# Patient Record
Sex: Female | Born: 1985 | Race: Black or African American | Hispanic: No | Marital: Single | Smoking: Current some day smoker
Health system: Southern US, Community
[De-identification: ages and names within clinical notes are randomized; demographics above are authoritative.]

## PROBLEM LIST (undated history)

## (undated) DIAGNOSIS — K2970 Gastritis, unspecified, without bleeding: Secondary | ICD-10-CM

## (undated) DIAGNOSIS — I499 Cardiac arrhythmia, unspecified: Secondary | ICD-10-CM

## (undated) DIAGNOSIS — K219 Gastro-esophageal reflux disease without esophagitis: Secondary | ICD-10-CM

## (undated) DIAGNOSIS — F411 Generalized anxiety disorder: Secondary | ICD-10-CM

## (undated) DIAGNOSIS — D649 Anemia, unspecified: Secondary | ICD-10-CM

## (undated) DIAGNOSIS — R51 Headache: Secondary | ICD-10-CM

## (undated) DIAGNOSIS — R519 Headache, unspecified: Secondary | ICD-10-CM

## (undated) DIAGNOSIS — J189 Pneumonia, unspecified organism: Secondary | ICD-10-CM

## (undated) DIAGNOSIS — F988 Other specified behavioral and emotional disorders with onset usually occurring in childhood and adolescence: Secondary | ICD-10-CM

## (undated) DIAGNOSIS — R7303 Prediabetes: Secondary | ICD-10-CM

## (undated) DIAGNOSIS — T7840XA Allergy, unspecified, initial encounter: Secondary | ICD-10-CM

## (undated) HISTORY — DX: Allergy, unspecified, initial encounter: T78.40XA

## (undated) HISTORY — DX: Generalized anxiety disorder: F41.1

---
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
## 2014-02-04 ENCOUNTER — Other Ambulatory Visit (HOSPITAL_COMMUNITY)
Admission: RE | Admit: 2014-02-04 | Discharge: 2014-02-04 | Disposition: A | Discharge status: Home / Self Care | Payer: 59 | Source: Ambulatory Visit | Attending: Obstetrics & Gynecology | Admitting: Obstetrics & Gynecology

## 2014-02-04 ENCOUNTER — Other Ambulatory Visit: Payer: Self-pay | Admitting: Obstetrics & Gynecology

## 2014-02-04 DIAGNOSIS — Z1151 Encounter for screening for human papillomavirus (HPV): Secondary | ICD-10-CM | POA: Diagnosis present

## 2014-02-04 DIAGNOSIS — Z01419 Encounter for gynecological examination (general) (routine) without abnormal findings: Secondary | ICD-10-CM | POA: Diagnosis present

## 2014-02-07 LAB — CYTOLOGY - PAP

## 2014-03-23 ENCOUNTER — Ambulatory Visit (INDEPENDENT_AMBULATORY_CARE_PROVIDER_SITE_OTHER): Payer: 59 | Admitting: Family Medicine

## 2014-03-23 ENCOUNTER — Ambulatory Visit (INDEPENDENT_AMBULATORY_CARE_PROVIDER_SITE_OTHER): Payer: 59

## 2014-03-23 VITALS — BP 122/80 | HR 85 | Temp 97.7°F | Resp 18 | Ht 64.0 in | Wt 312.0 lb

## 2014-03-23 DIAGNOSIS — M25562 Pain in left knee: Secondary | ICD-10-CM

## 2014-03-23 DIAGNOSIS — S86812A Strain of other muscle(s) and tendon(s) at lower leg level, left leg, initial encounter: Secondary | ICD-10-CM

## 2014-03-23 DIAGNOSIS — S86912A Strain of unspecified muscle(s) and tendon(s) at lower leg level, left leg, initial encounter: Secondary | ICD-10-CM

## 2014-03-23 MED ORDER — DICLOFENAC SODIUM 75 MG PO TBEC: 75.0000 mg | DELAYED_RELEASE_TABLET | Freq: Two times a day (BID) | ORAL | Status: DC

## 2014-03-23 NOTE — Patient Instructions (Signed)
Please let me know if the knee is not improving by this weekend.

## 2014-03-23 NOTE — Progress Notes (Signed)
Patient ID: Valerie GrimesRashada C Green MRN: [id number], DOB: 07/26/1985, 28 y.o. Date of Encounter: 03/23/2014, 7:39 PM  This chart was scribed for Dr. Elvina SidleKurt Dutch Ing, MD by Jarvis Morganaylor Ferguson, Medical Scribe. This patient was seen in Room 4 and the patient's care was started at 7:40 PM.   Primary Physician: No primary care provider on file.  Chief Complaint:  Chief Complaint  Patient presents with  . Knee Injury    jumping saturday  . Knee Pain  . Edema     HPI: 28 y.o. year old female with history below presents with an injury to her left knee. Pt states she injured it while at a trampoline park 4 days ago. She is now having a constant, waxing and waning, "tight and swollen" feeling in her left knee. She describes the pain as dull. Pt notes that the pain is exacerbated when she is sitting and the pain becomes "throbbing". She is having associated swelling in her right knee. She has been taking Naprosyn with no relief. Pt is able to ambulate without difficulty.  She states the pain is alleviated with ambulation. The pain is not keeping her up at night. She denies any color change, warmth, shortness of breath or chest pain.  Works at [employer]  Past Medical History  Diagnosis Date  . Anxiety      Home Meds: Prior to Admission medications   Not on File    Allergies: No Known Allergies  History   Social History  . Marital Status: Single    Spouse Name: N/A    Number of Children: N/A  . Years of Education: N/A   Occupational History  . Not on file.   Social History Main Topics  . Smoking status: Never Smoker   . Smokeless tobacco: Not on file  . Alcohol Use: No  . Drug Use: No  . Sexual Activity: Not on file   Other Topics Concern  . Not on file   Social History Narrative  . No narrative on file     Review of Systems: Constitutional: negative for chills, fever, night sweats, weight changes, or fatigue  HEENT: negative for vision changes, hearing loss,  congestion, rhinorrhea, ST, epistaxis, or sinus pressure Cardiovascular: negative for chest pain or palpitations Respiratory: negative for hemoptysis, wheezing, shortness of breath, or cough Abdominal: negative for abdominal pain, nausea, vomiting, diarrhea, or constipation Dermatological: negative for rash, warmth, or color change. Neurologic: negative for headache, dizziness, or syncope Musculoskeletal: positive for left knee pain and swelling. Negative for gait issue. All other systems reviewed and are otherwise negative with the exception to those above and in the HPI.   Physical Exam: Blood pressure 122/80, pulse 85, temperature 97.7 F (36.5 C), temperature source Oral, resp. rate 18, height 5\' 4"  (1.626 m), weight 312 lb (141.522 kg), last menstrual period 02/13/2014, SpO2 98 %., Body mass index is 53.53 kg/(m^2). General: Well developed, well nourished, in no acute distress. Head: Normocephalic, atraumatic, eyes without discharge, sclera non-icteric, nares are without discharge. Bilateral auditory canals clear, TM's are without perforation, pearly grey and translucent with reflective cone of light bilaterally. Oral cavity moist, posterior pharynx without exudate, erythema, peritonsillar abscess, or post nasal drip.  Neck: Supple. No thyromegaly. Full ROM. No lymphadenopathy. Lungs: Clear bilaterally to auscultation without wheezes, rales, or rhonchi. Breathing is unlabored. Heart: RRR with S1 S2. No murmurs, rubs, or gallops appreciated. Abdomen: Soft, non-tender, non-distended with normoactive bowel sounds. No hepatomegaly. No rebound/guarding. No obvious abdominal masses. Msk:  Strength and tone normal for age. Extremities/Skin: Warm and dry. No clubbing or cyanosis. No edema. No rashes or suspicious lesions. Neuro: Alert and oriented X 3. Moves all extremities spontaneously. Gait is normal. CNII-XII grossly in tact. Psych:  Responds to questions appropriately with a normal affect.     [facility] reading (PRIMARY) by  Dr. Milus GlazierLauenstein. Xray normal    ASSESSMENT AND PLAN:  28 y.o. year old female with   1. Left knee pain   2. Knee strain, left, initial encounter    Meds ordered this encounter  Medications  . diclofenac (VOLTAREN) 75 MG EC tablet    Sig: Take 1 tablet (75 mg total) by mouth 2 (two) times daily.    Dispense:  30 tablet    Refill:  0   I personally performed the services described in this documentation, which was scribed in my presence. The recorded information has been reviewed and is accurate.   Signed, Elvina SidleKurt Chantille Navarrete, MD 03/23/2014 7:39 PM

## 2014-04-24 ENCOUNTER — Ambulatory Visit (HOSPITAL_BASED_OUTPATIENT_CLINIC_OR_DEPARTMENT_OTHER): Payer: 59 | Attending: Internal Medicine

## 2014-06-27 ENCOUNTER — Ambulatory Visit
Admission: RE | Admit: 2014-06-27 | Discharge: 2014-06-27 | Disposition: A | Discharge status: Home / Self Care | Payer: 59 | Source: Ambulatory Visit | Attending: Internal Medicine | Admitting: Internal Medicine

## 2014-06-27 ENCOUNTER — Other Ambulatory Visit: Payer: Self-pay | Admitting: Internal Medicine

## 2014-06-27 DIAGNOSIS — R0602 Shortness of breath: Secondary | ICD-10-CM

## 2014-11-04 ENCOUNTER — Emergency Department (HOSPITAL_COMMUNITY)
Admission: EM | Admit: 2014-11-04 | Discharge: 2014-11-05 | Disposition: A | Discharge status: Home / Self Care | Payer: 59 | Attending: Emergency Medicine | Admitting: Emergency Medicine

## 2014-11-04 ENCOUNTER — Encounter (HOSPITAL_COMMUNITY): Payer: Self-pay | Admitting: *Deleted

## 2014-11-04 DIAGNOSIS — R197 Diarrhea, unspecified: Secondary | ICD-10-CM | POA: Insufficient documentation

## 2014-11-04 DIAGNOSIS — K2970 Gastritis, unspecified, without bleeding: Secondary | ICD-10-CM | POA: Diagnosis not present

## 2014-11-04 DIAGNOSIS — Z8659 Personal history of other mental and behavioral disorders: Secondary | ICD-10-CM | POA: Insufficient documentation

## 2014-11-04 DIAGNOSIS — Z3202 Encounter for pregnancy test, result negative: Secondary | ICD-10-CM | POA: Insufficient documentation

## 2014-11-04 DIAGNOSIS — R1013 Epigastric pain: Secondary | ICD-10-CM | POA: Diagnosis present

## 2014-11-04 DIAGNOSIS — Z791 Long term (current) use of non-steroidal anti-inflammatories (NSAID): Secondary | ICD-10-CM | POA: Diagnosis not present

## 2014-11-04 MED ORDER — GI COCKTAIL ~~LOC~~
30.0000 mL | Freq: Once | ORAL | Status: AC
  Administered 2014-11-04: 30 mL via ORAL
  Filled 2014-11-04: qty 30

## 2014-11-04 NOTE — ED Notes (Signed)
The pt is c/oi abd pain since Tuesday with diarrhea.  Hyperventilating at present and c/o chest pain  lmp last month

## 2014-11-04 NOTE — ED Provider Notes (Signed)
CSN: [id number]     Arrival date & time 11/04/14  2311 History   First MD Initiated Contact with Patient 11/04/14 2329     Chief Complaint  Patient presents with  . Abdominal Pain     (Consider location/radiation/quality/duration/timing/severity/associated sxs/prior Treatment) Patient is a 29 y.o. female presenting with abdominal pain.  Abdominal Pain Pain location:  Epigastric Pain quality: dull and shooting   Pain radiation: up to chest. Pain severity:  Severe Onset quality:  Gradual Duration:  4 days Timing:  Constant Progression:  Worsening Chronicity:  New Context: not alcohol use and not diet changes   Relieved by: pepto bismol. Worsened by:  Nothing tried (not worse after eating) Ineffective treatments: tylenol. Associated symptoms: diarrhea (NBNB) and nausea   Associated symptoms: no dysuria, no fever and no vomiting     Past Medical History  Diagnosis Date  . Anxiety    History reviewed. No pertinent past surgical history. Family History  Problem Relation Age of Onset  . Diabetes Mother   . Diabetes Father   . Hyperlipidemia Brother    History  Substance Use Topics  . Smoking status: Never Smoker   . Smokeless tobacco: Not on file  . Alcohol Use: No   OB History    No data available     Review of Systems  Constitutional: Negative for fever.  Gastrointestinal: Positive for nausea, abdominal pain and diarrhea (NBNB). Negative for vomiting.  Genitourinary: Negative for dysuria.  All other systems reviewed and are negative.     Allergies  Review of patient's allergies indicates no known allergies.  Home Medications   Prior to Admission medications   Medication Sig Start Date End Date Taking? Authorizing Provider  diclofenac (VOLTAREN) 75 MG EC tablet Take 1 tablet (75 mg total) by mouth 2 (two) times daily. 03/23/14   Elvina SidleKurt Lauenstein, MD   BP 126/90 mmHg  Pulse 72  Temp(Src) 98.5 F (36.9 C)  Resp 38  Ht 5\' 5"  (1.651 m)  Wt 321 lb 6 oz  (145.775 kg)  BMI 53.48 kg/m2  SpO2 100%  LMP 10/05/2014 Physical Exam  Constitutional: She is oriented to person, place, and time. She appears well-developed and well-nourished. No distress.  HENT:  Head: Normocephalic and atraumatic.  Mouth/Throat: Oropharynx is clear and moist.  Eyes: Conjunctivae are normal. Pupils are equal, round, and reactive to light. No scleral icterus.  Neck: Neck supple.  Cardiovascular: Normal rate, regular rhythm, normal heart sounds and intact distal pulses.   No murmur heard. Pulmonary/Chest: Effort normal and breath sounds normal. No stridor. No respiratory distress. She has no rales.  Abdominal: Soft. Bowel sounds are normal. She exhibits no distension. There is tenderness in the epigastric area and left upper quadrant. There is no rigidity, no rebound, no guarding and no CVA tenderness.  Musculoskeletal: Normal range of motion.  Neurological: She is alert and oriented to person, place, and time.  Skin: Skin is warm and dry. No rash noted.  Psychiatric: She has a normal mood and affect. Her behavior is normal.  Nursing note and vitals reviewed.   ED Course  Procedures (including critical care time) Labs Review Labs Reviewed  COMPREHENSIVE METABOLIC PANEL - Abnormal; Notable for the following:    Glucose, Bld 120 (*)    Total Bilirubin 0.2 (*)    All other components within normal limits  LIPASE, BLOOD - Abnormal; Notable for the following:    Lipase 21 (*)    All other components within normal limits  CBC WITH DIFFERENTIAL/PLATELET  URINALYSIS, ROUTINE W REFLEX MICROSCOPIC (NOT AT [facility])  POC URINE PREG, ED    Imaging Review No results found.   EKG Interpretation   Date/Time:  Friday November 04 2014 23:18:19 EDT Ventricular Rate:  58 PR Interval:  166 QRS Duration: 88 QT Interval:  404 QTC Calculation: 396 R Axis:   74 Text Interpretation:  Sinus bradycardia with sinus arrhythmia Otherwise  normal ECG No old tracing to compare  Confirmed by [facility]  MD, DAVID ([id number])  on 11/04/2014 11:26:41 PM      MDM   Final diagnoses:  Epigastric abdominal pain  Gastritis    29 yo female with epigastric pain.  Symptoms and exam consistent with gastritis.  No significant RUQ tenderness.  Pain not worse after eating. Don't think she needs imaging.  Felt better after GI cocktail, norco, and protonix.  She was breathing fast due to pain during initial exam, but this settled down after pain meds.  DC with PPI and follow up.     Blake Divine, MD 11/05/14 [phone]

## 2014-11-05 LAB — COMPREHENSIVE METABOLIC PANEL
ALBUMIN: 4 g/dL (ref 3.5–5.0)
ALK PHOS: 53 U/L (ref 38–126)
ALT: 16 U/L (ref 14–54)
AST: 25 U/L (ref 15–41)
Anion gap: 10 (ref 5–15)
BUN: 10 mg/dL (ref 6–20)
CALCIUM: 9.2 mg/dL (ref 8.9–10.3)
CO2: 25 mmol/L (ref 22–32)
Chloride: 103 mmol/L (ref 101–111)
Creatinine, Ser: 0.86 mg/dL (ref 0.44–1.00)
Glucose, Bld: 120 mg/dL — ABNORMAL HIGH (ref 65–99)
POTASSIUM: 3.7 mmol/L (ref 3.5–5.1)
Sodium: 138 mmol/L (ref 135–145)
TOTAL PROTEIN: 8.1 g/dL (ref 6.5–8.1)
Total Bilirubin: 0.2 mg/dL — ABNORMAL LOW (ref 0.3–1.2)

## 2014-11-05 LAB — CBC WITH DIFFERENTIAL/PLATELET
Basophils Absolute: 0 10*3/uL (ref 0.0–0.1)
Basophils Relative: 0 % (ref 0–1)
EOS ABS: 0.1 10*3/uL (ref 0.0–0.7)
Eosinophils Relative: 1 % (ref 0–5)
HEMATOCRIT: 37.3 % (ref 36.0–46.0)
HEMOGLOBIN: 12.9 g/dL (ref 12.0–15.0)
Lymphocytes Relative: 37 % (ref 12–46)
Lymphs Abs: 2.8 10*3/uL (ref 0.7–4.0)
MCH: 29.1 pg (ref 26.0–34.0)
MCHC: 34.6 g/dL (ref 30.0–36.0)
MCV: 84 fL (ref 78.0–100.0)
Monocytes Absolute: 0.6 10*3/uL (ref 0.1–1.0)
Monocytes Relative: 8 % (ref 3–12)
Neutro Abs: 4.1 10*3/uL (ref 1.7–7.7)
Neutrophils Relative %: 54 % (ref 43–77)
PLATELETS: 265 10*3/uL (ref 150–400)
RBC: 4.44 MIL/uL (ref 3.87–5.11)
RDW: 12.6 % (ref 11.5–15.5)
WBC: 7.6 10*3/uL (ref 4.0–10.5)

## 2014-11-05 LAB — URINALYSIS, ROUTINE W REFLEX MICROSCOPIC
Bilirubin Urine: NEGATIVE
Glucose, UA: NEGATIVE mg/dL
HGB URINE DIPSTICK: NEGATIVE
Ketones, ur: NEGATIVE mg/dL
Leukocytes, UA: NEGATIVE
NITRITE: NEGATIVE
PH: 6 (ref 5.0–8.0)
PROTEIN: NEGATIVE mg/dL
Specific Gravity, Urine: 1.024 (ref 1.005–1.030)
Urobilinogen, UA: 0.2 mg/dL (ref 0.0–1.0)

## 2014-11-05 LAB — POC URINE PREG, ED: Preg Test, Ur: NEGATIVE

## 2014-11-05 LAB — LIPASE, BLOOD: LIPASE: 21 U/L — AB (ref 22–51)

## 2014-11-05 MED ORDER — PANTOPRAZOLE SODIUM 40 MG PO TBEC
40.0000 mg | DELAYED_RELEASE_TABLET | Freq: Once | ORAL | Status: AC
  Administered 2014-11-05: 40 mg via ORAL
  Filled 2014-11-05: qty 1

## 2014-11-05 MED ORDER — HYDROCODONE-ACETAMINOPHEN 5-325 MG PO TABS: 1.0000 | ORAL_TABLET | ORAL | Status: DC | PRN

## 2014-11-05 MED ORDER — OMEPRAZOLE 20 MG PO CPDR: 20.0000 mg | DELAYED_RELEASE_CAPSULE | Freq: Two times a day (BID) | ORAL | Status: DC

## 2014-11-05 MED ORDER — HYDROCODONE-ACETAMINOPHEN 5-325 MG PO TABS
2.0000 | ORAL_TABLET | Freq: Once | ORAL | Status: AC
  Administered 2014-11-05: 2 via ORAL
  Filled 2014-11-05: qty 2

## 2014-11-05 NOTE — Discharge Instructions (Signed)
Abdominal Pain, Women °Abdominal (stomach, pelvic, or belly) pain can be caused by many things. It is important to tell your doctor: °· The location of the pain. °· Does it come and go or is it present all the time? °· Are there things that start the pain (eating certain foods, exercise)? °· Are there other symptoms associated with the pain (fever, nausea, vomiting, diarrhea)? °All of this is helpful to know when trying to find the cause of the pain. °CAUSES  °· Stomach: virus or bacteria infection, or ulcer. °· Intestine: appendicitis (inflamed appendix), regional ileitis (Crohn's disease), ulcerative colitis (inflamed colon), irritable bowel syndrome, diverticulitis (inflamed diverticulum of the colon), or cancer of the stomach or intestine. °· Gallbladder disease or stones in the gallbladder. °· Kidney disease, kidney stones, or infection. °· Pancreas infection or cancer. °· Fibromyalgia (pain disorder). °· Diseases of the female organs: °¨ Uterus: fibroid (non-cancerous) tumors or infection. °¨ Fallopian tubes: infection or tubal pregnancy. °¨ Ovary: cysts or tumors. °¨ Pelvic adhesions (scar tissue). °¨ Endometriosis (uterus lining tissue growing in the pelvis and on the pelvic organs). °¨ Pelvic congestion syndrome (female organs filling up with blood just before the menstrual period). °¨ Pain with the menstrual period. °¨ Pain with ovulation (producing an egg). °¨ Pain with an IUD (intrauterine device, birth control) in the uterus. °¨ Cancer of the female organs. °· Functional pain (pain not caused by a disease, may improve without treatment). °· Psychological pain. °· Depression. °DIAGNOSIS  °Your doctor will decide the seriousness of your pain by doing an examination. °· Blood tests. °· X-rays. °· Ultrasound. °· CT scan (computed tomography, special type of X-ray). °· MRI (magnetic resonance imaging). °· Cultures, for infection. °· Barium enema (dye inserted in the large intestine, to better view it with  X-rays). °· Colonoscopy (looking in intestine with a lighted tube). °· Laparoscopy (minor surgery, looking in abdomen with a lighted tube). °· Major abdominal exploratory surgery (looking in abdomen with a large incision). °TREATMENT  °The treatment will depend on the cause of the pain.  °· Many cases can be observed and treated at home. °· Over-the-counter medicines recommended by your caregiver. °· Prescription medicine. °· Antibiotics, for infection. °· Birth control pills, for painful periods or for ovulation pain. °· Hormone treatment, for endometriosis. °· Nerve blocking injections. °· Physical therapy. °· Antidepressants. °· Counseling with a psychologist or psychiatrist. °· Minor or major surgery. °HOME CARE INSTRUCTIONS  °· Do not take laxatives, unless directed by your caregiver. °· Take over-the-counter pain medicine only if ordered by your caregiver. Do not take aspirin because it can cause an upset stomach or bleeding. °· Try a clear liquid diet (broth or water) as ordered by your caregiver. Slowly move to a bland diet, as tolerated, if the pain is related to the stomach or intestine. °· Have a thermometer and take your temperature several times a day, and record it. °· Bed rest and sleep, if it helps the pain. °· Avoid sexual intercourse, if it causes pain. °· Avoid stressful situations. °· Keep your follow-up appointments and tests, as your caregiver orders. °· If the pain does not go away with medicine or surgery, you may try: °¨ Acupuncture. °¨ Relaxation exercises (yoga, meditation). °¨ Group therapy. °¨ Counseling. °SEEK MEDICAL CARE IF:  °· You notice certain foods cause stomach pain. °· Your home care treatment is not helping your pain. °· You need stronger pain medicine. °· You want your IUD removed. °· You feel faint or   lightheaded. °· You develop nausea and vomiting. °· You develop a rash. °· You are having side effects or an allergy to your medicine. °SEEK IMMEDIATE MEDICAL CARE IF:  °· Your  pain does not go away or gets worse. °· You have a fever. °· Your pain is felt only in portions of the abdomen. The right side could possibly be appendicitis. The left lower portion of the abdomen could be colitis or diverticulitis. °· You are passing blood in your stools (bright red or black tarry stools, with or without vomiting). °· You have blood in your urine. °· You develop chills, with or without a fever. °· You pass out. °MAKE SURE YOU:  °· Understand these instructions. °· Will watch your condition. °· Will get help right away if you are not doing well or get worse. °Document Released: 02/17/2007 Document Revised: 09/06/2013 Document Reviewed: 03/09/2009 °ExitCare® Patient Information ©2015 [employer]. This information is not intended to replace advice given to you by your health care provider. Make sure you discuss any questions you have with your health care provider. ° °Gastritis, Adult °Gastritis is soreness and swelling (inflammation) of the lining of the stomach. Gastritis can develop as a sudden onset (acute) or long-term (chronic) condition. If gastritis is not treated, it can lead to stomach bleeding and ulcers. °CAUSES  °Gastritis occurs when the stomach lining is weak or damaged. Digestive juices from the stomach then inflame the weakened stomach lining. The stomach lining may be weak or damaged due to viral or bacterial infections. One common bacterial infection is the Helicobacter pylori infection. Gastritis can also result from excessive alcohol consumption, taking certain medicines, or having too much acid in the stomach.  °SYMPTOMS  °In some cases, there are no symptoms. When symptoms are present, they may include: °· Pain or a burning sensation in the upper abdomen. °· Nausea. °· Vomiting. °· An uncomfortable feeling of fullness after eating. °DIAGNOSIS  °Your caregiver may suspect you have gastritis based on your symptoms and a physical exam. To determine the cause of your gastritis,  your caregiver may perform the following: °· Blood or stool tests to check for the H pylori bacterium. °· Gastroscopy. A thin, flexible tube (endoscope) is passed down the esophagus and into the stomach. The endoscope has a light and camera on the end. Your caregiver uses the endoscope to view the inside of the stomach. °· Taking a tissue sample (biopsy) from the stomach to examine under a microscope. °TREATMENT  °Depending on the cause of your gastritis, medicines may be prescribed. If you have a bacterial infection, such as an H pylori infection, antibiotics may be given. If your gastritis is caused by too much acid in the stomach, H2 blockers or antacids may be given. Your caregiver may recommend that you stop taking aspirin, ibuprofen, or other nonsteroidal anti-inflammatory drugs (NSAIDs). °HOME CARE INSTRUCTIONS °· Only take over-the-counter or prescription medicines as directed by your caregiver. °· If you were given antibiotic medicines, take them as directed. Finish them even if you start to feel better. °· Drink enough fluids to keep your urine clear or pale yellow. °· Avoid foods and drinks that make your symptoms worse, such as: °¨ Caffeine or alcoholic drinks. °¨ Chocolate. °¨ Peppermint or mint flavorings. °¨ Garlic and onions. °¨ Spicy foods. °¨ Citrus fruits, such as oranges, lemons, or limes. °¨ Tomato-based foods such as sauce, chili, salsa, and pizza. °¨ Fried and fatty foods. °· Eat small, frequent meals instead of large meals. °  SEEK IMMEDIATE MEDICAL CARE IF:  °· You have black or dark red stools. °· You vomit blood or material that looks like coffee grounds. °· You are unable to keep fluids down. °· Your abdominal pain gets worse. °· You have a fever. °· You do not feel better after 1 week. °· You have any other questions or concerns. °MAKE SURE YOU: °· Understand these instructions. °· Will watch your condition. °· Will get help right away if you are not doing well or get worse. °Document  Released: 04/16/2001 Document Revised: 10/22/2011 Document Reviewed: 06/05/2011 °ExitCare® Patient Information ©2015 [employer]. This information is not intended to replace advice given to you by your health care provider. Make sure you discuss any questions you have with your health care provider. ° °

## 2015-03-09 ENCOUNTER — Emergency Department (HOSPITAL_COMMUNITY): Payer: 59

## 2015-03-09 ENCOUNTER — Emergency Department (HOSPITAL_COMMUNITY)
Admission: EM | Admit: 2015-03-09 | Discharge: 2015-03-09 | Disposition: A | Discharge status: Home / Self Care | Payer: 59 | Attending: Emergency Medicine | Admitting: Emergency Medicine

## 2015-03-09 ENCOUNTER — Encounter (HOSPITAL_COMMUNITY): Payer: Self-pay

## 2015-03-09 DIAGNOSIS — R1011 Right upper quadrant pain: Secondary | ICD-10-CM | POA: Diagnosis present

## 2015-03-09 DIAGNOSIS — Z8659 Personal history of other mental and behavioral disorders: Secondary | ICD-10-CM | POA: Insufficient documentation

## 2015-03-09 DIAGNOSIS — Z79899 Other long term (current) drug therapy: Secondary | ICD-10-CM | POA: Insufficient documentation

## 2015-03-09 DIAGNOSIS — Z3202 Encounter for pregnancy test, result negative: Secondary | ICD-10-CM | POA: Insufficient documentation

## 2015-03-09 DIAGNOSIS — K8020 Calculus of gallbladder without cholecystitis without obstruction: Secondary | ICD-10-CM

## 2015-03-09 DIAGNOSIS — K2970 Gastritis, unspecified, without bleeding: Secondary | ICD-10-CM | POA: Insufficient documentation

## 2015-03-09 HISTORY — DX: Other specified behavioral and emotional disorders with onset usually occurring in childhood and adolescence: F98.8

## 2015-03-09 HISTORY — DX: Gastritis, unspecified, without bleeding: K29.70

## 2015-03-09 LAB — URINALYSIS, ROUTINE W REFLEX MICROSCOPIC
Bilirubin Urine: NEGATIVE
Glucose, UA: NEGATIVE mg/dL
Hgb urine dipstick: NEGATIVE
KETONES UR: 15 mg/dL — AB
LEUKOCYTES UA: NEGATIVE
Nitrite: NEGATIVE
PH: 6 (ref 5.0–8.0)
Protein, ur: NEGATIVE mg/dL
Specific Gravity, Urine: 1.029 (ref 1.005–1.030)
Urobilinogen, UA: 1 mg/dL (ref 0.0–1.0)

## 2015-03-09 LAB — CBC WITH DIFFERENTIAL/PLATELET
BASOS ABS: 0 10*3/uL (ref 0.0–0.1)
Basophils Relative: 0 %
Eosinophils Absolute: 0 10*3/uL (ref 0.0–0.7)
Eosinophils Relative: 0 %
HCT: 36.9 % (ref 36.0–46.0)
HEMOGLOBIN: 12.8 g/dL (ref 12.0–15.0)
Lymphocytes Relative: 21 %
Lymphs Abs: 1.9 10*3/uL (ref 0.7–4.0)
MCH: 29.5 pg (ref 26.0–34.0)
MCHC: 34.7 g/dL (ref 30.0–36.0)
MCV: 85 fL (ref 78.0–100.0)
MONO ABS: 0.4 10*3/uL (ref 0.1–1.0)
MONOS PCT: 4 %
NEUTROS ABS: 6.9 10*3/uL (ref 1.7–7.7)
Neutrophils Relative %: 75 %
PLATELETS: 230 10*3/uL (ref 150–400)
RBC: 4.34 MIL/uL (ref 3.87–5.11)
RDW: 12.7 % (ref 11.5–15.5)
WBC: 9.2 10*3/uL (ref 4.0–10.5)

## 2015-03-09 LAB — COMPREHENSIVE METABOLIC PANEL
ALK PHOS: 53 U/L (ref 38–126)
ALT: 19 U/L (ref 14–54)
AST: 25 U/L (ref 15–41)
Albumin: 4.1 g/dL (ref 3.5–5.0)
Anion gap: 10 (ref 5–15)
BUN: 7 mg/dL (ref 6–20)
CO2: 25 mmol/L (ref 22–32)
CREATININE: 0.98 mg/dL (ref 0.44–1.00)
Calcium: 9.3 mg/dL (ref 8.9–10.3)
Chloride: 104 mmol/L (ref 101–111)
GFR calc non Af Amer: >60 mL/min (ref >60)
Glucose, Bld: 143 mg/dL — ABNORMAL HIGH (ref 65–99)
Potassium: 3.7 mmol/L (ref 3.5–5.1)
SODIUM: 139 mmol/L (ref 135–145)
Total Bilirubin: 0.4 mg/dL (ref 0.3–1.2)
Total Protein: 8.2 g/dL — ABNORMAL HIGH (ref 6.5–8.1)

## 2015-03-09 LAB — I-STAT CHEM 8, ED
BUN: 8 mg/dL (ref 6–20)
CREATININE: 0.9 mg/dL (ref 0.44–1.00)
Calcium, Ion: 1.13 mmol/L (ref 1.12–1.23)
Chloride: 102 mmol/L (ref 101–111)
GLUCOSE: 135 mg/dL — AB (ref 65–99)
HCT: 41 % (ref 36.0–46.0)
Hemoglobin: 13.9 g/dL (ref 12.0–15.0)
Potassium: 3.6 mmol/L (ref 3.5–5.1)
Sodium: 140 mmol/L (ref 135–145)
TCO2: 24 mmol/L (ref 0–100)

## 2015-03-09 LAB — POC URINE PREG, ED: PREG TEST UR: NEGATIVE

## 2015-03-09 LAB — LIPASE, BLOOD: Lipase: 24 U/L (ref 11–51)

## 2015-03-09 MED ORDER — HYDROCODONE-ACETAMINOPHEN 5-325 MG PO TABS: 2.0000 | ORAL_TABLET | Freq: Two times a day (BID) | ORAL | Status: DC | PRN

## 2015-03-09 MED ORDER — MORPHINE SULFATE (PF) 4 MG/ML IV SOLN
4.0000 mg | Freq: Once | INTRAVENOUS | Status: AC
Start: 2015-03-09 — End: 2015-03-09
  Administered 2015-03-09: 4 mg via INTRAVENOUS
  Filled 2015-03-09: qty 1

## 2015-03-09 MED ORDER — ONDANSETRON 4 MG PO TBDP: 4.0000 mg | ORAL_TABLET | Freq: Three times a day (TID) | ORAL | Status: DC | PRN

## 2015-03-09 NOTE — ED Notes (Signed)
Pt states that she took omeprazole and tramadol at home with no pain relief, pt states that she may have thrown up the medication.

## 2015-03-09 NOTE — ED Notes (Signed)
MD at bedside. 

## 2015-03-09 NOTE — ED Notes (Signed)
Per pt she is having right upper quadrant pain that started around 12 am tonight. She states that she has vomited 2 times, denies diarrhea. Pt states that she has been diagnosed with gastritis before and this feels similar, but not the same.

## 2015-03-09 NOTE — ED Provider Notes (Signed)
CSN: [id number]     Arrival date & time 03/09/15  0453 History   First MD Initiated Contact with Patient 03/09/15 0456     Chief Complaint  Patient presents with  . Abdominal Pain     (Consider location/radiation/quality/duration/timing/severity/associated sxs/prior Treatment) HPI   Valerie Green is a 29 y.o. female with no significant past medical history presenting today with abdominal pain. Patient states it began around midnight, is in her right upper quadrant and is described as pain that radiates up into her throat. She states she has been seen for this before diagnosed with gastritis. Patient has been taking omeprazole and has been compliant, this has not relieved her pain. She had 2 episodes of emesis tonight as well. She denies any lower abdominal pain, or changes in her bowel or bladder. She's had no fevers. She denies any history of gallbladder or liver pathology.  Currently she is denying nausea and only has pain. She has no further complaints.  10 Systems reviewed and are negative for acute change except as noted in the HPI.     Past Medical History  Diagnosis Date  . Anxiety   . Gastritis   . ADD (attention deficit disorder)    Past Surgical History  Procedure Laterality Date  . Tooth extraction     Family History  Problem Relation Age of Onset  . Diabetes Mother   . Diabetes Father   . Hyperlipidemia Brother    Social History  Substance Use Topics  . Smoking status: Never Smoker   . Smokeless tobacco: None  . Alcohol Use: No   OB History    No data available     Review of Systems    Allergies  Milk-related compounds  Home Medications   Prior to Admission medications   Medication Sig Start Date End Date Taking? Authorizing Provider  HYDROcodone-acetaminophen (NORCO/VICODIN) 5-325 MG per tablet Take 1-2 tablets by mouth every 4 (four) hours as needed for severe pain. 11/05/14   Blake Divine, MD  omeprazole (PRILOSEC) 20 MG capsule Take 1 capsule  (20 mg total) by mouth 2 (two) times daily before a meal. 11/05/14   Blake Divine, MD   BP 116/66 mmHg  Pulse 60  Temp(Src) 97.4 F (36.3 C) (Oral)  Resp 18  Ht  (1.651 m)  SpO2 100%  LMP 03/05/2015 Physical Exam  Constitutional: She is oriented to person, place, and time. She appears well-developed and well-nourished. No distress.  HENT:  Head: Normocephalic and atraumatic.  Nose: Nose normal.  Mouth/Throat: Oropharynx is clear and moist. No oropharyngeal exudate.  Eyes: Conjunctivae and EOM are normal. Pupils are equal, round, and reactive to light. No scleral icterus.  Neck: Normal range of motion. Neck supple. No JVD present. No tracheal deviation present. No thyromegaly present.  Cardiovascular: Normal rate, regular rhythm and normal heart sounds.  Exam reveals no gallop and no friction rub.   No murmur heard. Pulmonary/Chest: Effort normal and breath sounds normal. No respiratory distress. She has no wheezes. She exhibits no tenderness.  Abdominal: Soft. Bowel sounds are normal. She exhibits no distension and no mass. There is tenderness. There is no rebound and no guarding.  Right upper quadrant tenderness to palpation. Positive Murphy's sign.  Musculoskeletal: Normal range of motion. She exhibits no edema or tenderness.  Lymphadenopathy:    She has no cervical adenopathy.  Neurological: She is alert and oriented to person, place, and time. No cranial nerve deficit. She exhibits normal muscle tone.  Skin:  Skin is warm and dry. No rash noted. No erythema. No pallor.  Nursing note and vitals reviewed.   ED Course  Procedures (including critical care time) Labs Review Labs Reviewed  COMPREHENSIVE METABOLIC PANEL - Abnormal; Notable for the following:    Glucose, Bld 143 (*)    Total Protein 8.2 (*)    All other components within normal limits  URINALYSIS, ROUTINE W REFLEX MICROSCOPIC (NOT AT [facility]) - Abnormal; Notable for the following:    Ketones, ur 15 (*)    All  other components within normal limits  I-STAT CHEM 8, ED - Abnormal; Notable for the following:    Glucose, Bld 135 (*)    All other components within normal limits  CBC WITH DIFFERENTIAL/PLATELET  LIPASE, BLOOD  POC URINE PREG, ED    Imaging Review [place] Abdomen Limited Ruq  03/09/2015  CLINICAL DATA:  Right upper quadrant pain, vomiting. EXAM: US ABDOMEN LIMITED - RIGHT UPPER QUADRANT COMPARISON:  None. FINDINGS: Gallbladder: Two calculi, the largest measuring 13 mm. No gallbladder wall thickening. No tenderness over the gallbladder. Common bile duct: Diameter: 4.6 mm Liver: No focal lesion identified. Within normal limits in parenchymal echogenicity. IMPRESSION: Cholelithiasis.  No sonographic evidence of acute cholecystitis. Electronically Signed   By: Ellery Plunkaniel R Mitchell M.D.   On: 03/09/2015 06:06   I have personally reviewed and evaluated these images and lab results as part of my medical decision-making.   EKG Interpretation None      MDM   Final diagnoses:  RUQ pain    Patient presents to the emergency department for right upper quadrant pain. I have concern for possible biliary or liver pathology. We'll obtain laboratory studies, right upper quadrant ultrasound for evaluation. Patient was given morphine for pain control.  Ultrasound reveals 2 large calculi, largest measuring 13 mm. Patient now feels better after medications. Education regarding cholelithiasis is provided along with surgical follow-up. She appears well in no acute distress, vital signs remain within her normal limits and she is safe for discharge.    Tomasita CrumbleAdeleke Micky Sheller, MD 03/09/15 [phone]

## 2015-03-09 NOTE — Discharge Instructions (Signed)
Cholelithiasis Ms. Reeves, your ultrasound shows gallstones. Take nausea and pain medication as needed. See surgery within 3 days for close follow-up. If any symptoms worsen or he develop fever come back to the emergency department immediately. Thank you.  Cholelithiasis (also called gallstones) is a form of gallbladder disease. The gallbladder is a small organ that helps you digest fats. Symptoms of gallstones are:  Feeling sick to your stomach (nausea).  Throwing up (vomiting).  Belly pain.  Yellowing of the skin (jaundice).  Sudden pain. You may feel the pain for minutes to hours.  Fever.  Pain to the touch. HOME CARE  Only take medicines as told by your doctor.  Eat a low-fat diet until you see your doctor again. Eating fat can result in pain.  Follow up with your doctor as told. Attacks usually happen time after time. Surgery is usually needed for permanent treatment. GET HELP RIGHT AWAY IF:   Your pain gets worse.  Your pain is not helped by medicines.  You have a fever and lasting symptoms for more than 2-3 days.  You have a fever and your symptoms suddenly get worse.  You keep feeling sick to your stomach and throwing up. MAKE SURE YOU:   Understand these instructions.  Will watch your condition.  Will get help right away if you are not doing well or get worse.   This information is not intended to replace advice given to you by your health care provider. Make sure you discuss any questions you have with your health care provider.   Document Released: 10/09/2007 Document Revised: 12/23/2012 Document Reviewed: 10/14/2012 Elsevier Interactive Patient Education [employer]2016 Elsevier Inc.

## 2015-05-12 ENCOUNTER — Emergency Department (HOSPITAL_COMMUNITY)
Admission: EM | Admit: 2015-05-12 | Discharge: 2015-05-12 | Discharge status: Against Medical Advice | Payer: 59 | Attending: Emergency Medicine | Admitting: Emergency Medicine

## 2015-05-12 ENCOUNTER — Encounter (HOSPITAL_COMMUNITY): Payer: Self-pay | Admitting: Emergency Medicine

## 2015-05-12 DIAGNOSIS — R197 Diarrhea, unspecified: Secondary | ICD-10-CM | POA: Diagnosis not present

## 2015-05-12 DIAGNOSIS — K625 Hemorrhage of anus and rectum: Secondary | ICD-10-CM | POA: Diagnosis not present

## 2015-05-12 DIAGNOSIS — R110 Nausea: Secondary | ICD-10-CM | POA: Diagnosis not present

## 2015-05-12 LAB — COMPREHENSIVE METABOLIC PANEL
ALT: 31 U/L (ref 14–54)
ANION GAP: 7 (ref 5–15)
AST: 30 U/L (ref 15–41)
Albumin: 3.8 g/dL (ref 3.5–5.0)
Alkaline Phosphatase: 52 U/L (ref 38–126)
BILIRUBIN TOTAL: 0.4 mg/dL (ref 0.3–1.2)
BUN: 8 mg/dL (ref 6–20)
CALCIUM: 9.2 mg/dL (ref 8.9–10.3)
CO2: 26 mmol/L (ref 22–32)
CREATININE: 0.9 mg/dL (ref 0.44–1.00)
Chloride: 108 mmol/L (ref 101–111)
GFR calc non Af Amer: >60 mL/min (ref >60)
GLUCOSE: 126 mg/dL — AB (ref 65–99)
POTASSIUM: 3.9 mmol/L (ref 3.5–5.1)
Sodium: 141 mmol/L (ref 135–145)
Total Protein: 7.6 g/dL (ref 6.5–8.1)

## 2015-05-12 LAB — LIPASE, BLOOD: Lipase: 27 U/L (ref 11–51)

## 2015-05-12 LAB — CBC
HCT: 35.6 % — ABNORMAL LOW (ref 36.0–46.0)
Hemoglobin: 12.1 g/dL (ref 12.0–15.0)
MCH: 29 pg (ref 26.0–34.0)
MCHC: 34 g/dL (ref 30.0–36.0)
MCV: 85.4 fL (ref 78.0–100.0)
PLATELETS: 231 10*3/uL (ref 150–400)
RBC: 4.17 MIL/uL (ref 3.87–5.11)
RDW: 12.7 % (ref 11.5–15.5)
WBC: 7.9 10*3/uL (ref 4.0–10.5)

## 2015-05-12 LAB — URINE MICROSCOPIC-ADD ON: WBC, UA: NONE SEEN WBC/hpf (ref 0–5)

## 2015-05-12 LAB — URINALYSIS, ROUTINE W REFLEX MICROSCOPIC
Bilirubin Urine: NEGATIVE
Glucose, UA: NEGATIVE mg/dL
KETONES UR: NEGATIVE mg/dL
LEUKOCYTES UA: NEGATIVE
Nitrite: NEGATIVE
PROTEIN: NEGATIVE mg/dL
Specific Gravity, Urine: 1.018 (ref 1.005–1.030)
pH: 5.5 (ref 5.0–8.0)

## 2015-05-12 LAB — POC URINE PREG, ED: Preg Test, Ur: NEGATIVE

## 2015-05-12 NOTE — ED Notes (Signed)
Pt informed this RN that she was leaving and did not need to be seen. Pt stated that "no I do not need any services, it was a false alarm. The bleeding is coming from a different hole." Pt signed out AMA.    MD and Charge RN notified

## 2015-05-12 NOTE — ED Notes (Signed)
Pt. reports nausea , diarrhea and rectal bleeding onset yesterday , denies emesis , no fever or chills. Pt. stated rectal bleeding started after several bouts of diarrhea .

## 2015-09-07 ENCOUNTER — Ambulatory Visit (INDEPENDENT_AMBULATORY_CARE_PROVIDER_SITE_OTHER): Payer: 59 | Admitting: Gastroenterology

## 2015-09-07 ENCOUNTER — Ambulatory Visit: Payer: Self-pay | Admitting: Gastroenterology

## 2015-09-07 VITALS — BP 146/91 | HR 62 | Temp 98.0°F | Ht 65.0 in | Wt 301.0 lb

## 2015-09-07 DIAGNOSIS — R1115 Cyclical vomiting syndrome unrelated to migraine: Secondary | ICD-10-CM

## 2015-09-07 DIAGNOSIS — F411 Generalized anxiety disorder: Secondary | ICD-10-CM

## 2015-09-07 DIAGNOSIS — G43A0 Cyclical vomiting, not intractable: Secondary | ICD-10-CM

## 2015-09-07 DIAGNOSIS — K582 Mixed irritable bowel syndrome: Secondary | ICD-10-CM | POA: Diagnosis not present

## 2015-09-07 DIAGNOSIS — G8929 Other chronic pain: Secondary | ICD-10-CM | POA: Diagnosis not present

## 2015-09-07 DIAGNOSIS — R1013 Epigastric pain: Secondary | ICD-10-CM

## 2015-09-07 DIAGNOSIS — F419 Anxiety disorder, unspecified: Secondary | ICD-10-CM | POA: Insufficient documentation

## 2015-09-07 HISTORY — DX: Generalized anxiety disorder: F41.1

## 2015-09-07 NOTE — Progress Notes (Signed)
Gastroenterology Consultation  Referring Provider:     No ref. provider found Primary Care Physician:  No PCP Per Patient Primary Gastroenterologist:  Dr. Servando SnareWohl     Reason for Consultation:     Abdominal pain        HPI:   Valerie Green is a 30 y.o. y/o female referred for consultation & management of Abdominal pain by Dr. Bonnetta BarryNo PCP Per Patient.  This patient comes today with a history of abdominal pain. The patient reports that her abdominal pain is associated with heartburn and alternating diarrhea and constipation. The patient states that she has not had any unexplained weight loss. The patient's symptoms resulted in her undergoing an ultrasound which showed her to have gallstones but no sign of acute cholecystitis. There is no report of any black stools or bloody stools. There is no family history of colon cancer colon polyps. The patient reports that chocolate and spicy foods make her symptoms worse as does some fatty foods. There is no report of any blood in her vomitus. The pain will start in the epigastric area and then it will radiate around to the right side. There is no radiation of the pain to the shoulder.  Past Medical History  Diagnosis Date  . Anxiety   . Gastritis   . ADD (attention deficit disorder)   . Anxiety state 09/07/2015  . Allergy     Past Surgical History  Procedure Laterality Date  . Tooth extraction  2000    Prior to Admission medications   Medication Sig Start Date End Date Taking? Authorizing Provider  acetaminophen (TYLENOL) 500 MG tablet Take 500-1,000 mg by mouth every 6 (six) hours as needed for mild pain.   Yes Historical Provider, MD  cetirizine (ZYRTEC) 10 MG tablet Take 10 mg by mouth daily.   Yes Historical Provider, MD  Multiple Vitamin (MULTIVITAMIN) tablet Take 1 tablet by mouth daily.   Yes Historical Provider, MD  omeprazole (PRILOSEC) 20 MG capsule Take 1 capsule (20 mg total) by mouth 2 (two) times daily before a meal. Patient taking  differently: Take 20 mg by mouth 2 (two) times daily as needed (acid reflux).  11/05/14  Yes Blake DivineJohn Wofford, MD    Family History  Problem Relation Age of Onset  . Diabetes Mother   . Diabetes Father   . Hyperlipidemia Brother   . Heart disease Maternal Aunt   . Diabetes Paternal Uncle      Social History  Substance Use Topics  . Smoking status: Never Smoker   . Smokeless tobacco: Never Used  . Alcohol Use: No    Allergies as of 09/07/2015 - Review Complete 09/07/2015  Allergen Reaction Noted  . Milk-related compounds Other (See Comments) 11/04/2014    Review of Systems:    All systems reviewed and negative except where noted in HPI.   Physical Exam:  BP 146/91 mmHg  Pulse 62  Temp(Src) 98 F (36.7 C) (Oral)  Ht 5\' 5"  (1.651 m)  Wt 301 lb (136.533 kg)  BMI 50.09 kg/m2  LMP 08/21/2015 Patient's last menstrual period was 08/21/2015. Psych:  Alert and cooperative. Normal mood and affect. General:   Alert,  Well-developed, obese, well-nourished, pleasant and cooperative in NAD Head:  Normocephalic and atraumatic. Eyes:  Sclera clear, no icterus.   Conjunctiva pink. Ears:  Normal auditory acuity. Nose:  No deformity, discharge, or lesions. Mouth:  No deformity or lesions,oropharynx pink & moist. Neck:  Supple; no masses or thyromegaly. Lungs:  Respirations even and unlabored.  Clear throughout to auscultation.   No wheezes, crackles, or rhonchi. No acute distress. Heart:  Regular rate and rhythm; no murmurs, clicks, rubs, or gallops. Abdomen:  Normal bowel sounds.  No bruits.  Soft, non-tender and non-distended without masses, hepatosplenomegaly or hernias noted.  No guarding or rebound tenderness.  Negative Carnett sign.   Rectal:  Deferred.  Msk:  Symmetrical without gross deformities.  Good, equal movement & strength bilaterally. Pulses:  Normal pulses noted. Extremities:  No clubbing or edema.  No cyanosis. Neurologic:  Alert and oriented x3;  grossly normal  neurologically. Skin:  Intact without significant lesions or rashes.  No jaundice. Lymph Nodes:  No significant cervical adenopathy. Psych:  Alert and cooperative. Normal mood and affect.  Imaging Studies: No results found.  Assessment and Plan:   Valerie Green is a 30 y.o. y/o female who comes in today with epigastric discomfort that radiates to the right side. The patient reports it is made worse with chocolate and spicy foods in addition to fatty foods. The patient had an ultrasound that had some gallstones but no sign of all bladder disease. The patient takes Prilosec as needed and states that it does help at times when she does it. The patient has been given samples of Dexilant 60 mg to be taken once a day. She will try this for a few weeks and if her symptoms do not improve she may need to be tried on dicyclomine for possible irritable bowel syndrome with her alternating diarrhea and constipation and bloating. The patient has been explained the plan and agrees with it.   Note: This dictation was prepared with Dragon dictation along with smaller phrase technology. Any transcriptional errors that result from this process are unintentional.

## 2015-10-10 ENCOUNTER — Ambulatory Visit: Payer: 59 | Admitting: Gastroenterology

## 2015-11-10 ENCOUNTER — Ambulatory Visit (HOSPITAL_COMMUNITY)
Admission: EM | Admit: 2015-11-10 | Discharge: 2015-11-10 | Disposition: A | Discharge status: Nursing Home (Includes ICF) | Payer: 59 | Attending: Family Medicine | Admitting: Family Medicine

## 2015-11-10 ENCOUNTER — Encounter (HOSPITAL_COMMUNITY): Payer: Self-pay | Admitting: *Deleted

## 2015-11-10 ENCOUNTER — Encounter (HOSPITAL_COMMUNITY): Payer: Self-pay | Admitting: Emergency Medicine

## 2015-11-10 ENCOUNTER — Emergency Department (HOSPITAL_COMMUNITY)
Admission: EM | Admit: 2015-11-10 | Discharge: 2015-11-11 | Disposition: A | Discharge status: Home / Self Care | Payer: 59 | Attending: Emergency Medicine | Admitting: Emergency Medicine

## 2015-11-10 DIAGNOSIS — R1013 Epigastric pain: Secondary | ICD-10-CM | POA: Diagnosis not present

## 2015-11-10 DIAGNOSIS — R109 Unspecified abdominal pain: Secondary | ICD-10-CM

## 2015-11-10 DIAGNOSIS — K8050 Calculus of bile duct without cholangitis or cholecystitis without obstruction: Secondary | ICD-10-CM | POA: Diagnosis not present

## 2015-11-10 DIAGNOSIS — R1011 Right upper quadrant pain: Secondary | ICD-10-CM | POA: Diagnosis not present

## 2015-11-10 LAB — LIPASE, BLOOD: LIPASE: 25 U/L (ref 11–51)

## 2015-11-10 LAB — CBC
HEMATOCRIT: 36.2 % (ref 36.0–46.0)
HEMOGLOBIN: 12 g/dL (ref 12.0–15.0)
MCH: 28.4 pg (ref 26.0–34.0)
MCHC: 33.1 g/dL (ref 30.0–36.0)
MCV: 85.6 fL (ref 78.0–100.0)
PLATELETS: 261 10*3/uL (ref 150–400)
RBC: 4.23 MIL/uL (ref 3.87–5.11)
RDW: 12.6 % (ref 11.5–15.5)
WBC: 6.7 10*3/uL (ref 4.0–10.5)

## 2015-11-10 LAB — COMPREHENSIVE METABOLIC PANEL
ALT: 17 U/L (ref 14–54)
ANION GAP: 6 (ref 5–15)
AST: 23 U/L (ref 15–41)
Albumin: 3.8 g/dL (ref 3.5–5.0)
Alkaline Phosphatase: 49 U/L (ref 38–126)
BUN: 10 mg/dL (ref 6–20)
CHLORIDE: 106 mmol/L (ref 101–111)
CO2: 25 mmol/L (ref 22–32)
CREATININE: 0.82 mg/dL (ref 0.44–1.00)
Calcium: 9.2 mg/dL (ref 8.9–10.3)
Glucose, Bld: 138 mg/dL — ABNORMAL HIGH (ref 65–99)
POTASSIUM: 3.9 mmol/L (ref 3.5–5.1)
SODIUM: 137 mmol/L (ref 135–145)
Total Bilirubin: <0.1 mg/dL — ABNORMAL LOW (ref 0.3–1.2)
Total Protein: 7.7 g/dL (ref 6.5–8.1)

## 2015-11-10 LAB — URINALYSIS, ROUTINE W REFLEX MICROSCOPIC
Bilirubin Urine: NEGATIVE
GLUCOSE, UA: NEGATIVE mg/dL
Ketones, ur: NEGATIVE mg/dL
LEUKOCYTES UA: NEGATIVE
Nitrite: NEGATIVE
PROTEIN: NEGATIVE mg/dL
SPECIFIC GRAVITY, URINE: 1.031 — AB (ref 1.005–1.030)
pH: 6.5 (ref 5.0–8.0)

## 2015-11-10 LAB — URINE MICROSCOPIC-ADD ON: WBC, UA: NONE SEEN WBC/hpf (ref 0–5)

## 2015-11-10 MED ORDER — HYDROMORPHONE HCL 1 MG/ML IJ SOLN
INTRAMUSCULAR | Status: AC
  Filled 2015-11-10: qty 2

## 2015-11-10 MED ORDER — ONDANSETRON 4 MG PO TBDP
4.0000 mg | ORAL_TABLET | Freq: Once | ORAL | Status: AC
  Administered 2015-11-10: 4 mg via ORAL

## 2015-11-10 MED ORDER — ONDANSETRON 4 MG PO TBDP
ORAL_TABLET | ORAL | Status: AC
  Filled 2015-11-10: qty 1

## 2015-11-10 MED ORDER — HYDROMORPHONE HCL 1 MG/ML IJ SOLN
2.0000 mg | Freq: Once | INTRAMUSCULAR | Status: AC
  Administered 2015-11-10: 2 mg via INTRAMUSCULAR

## 2015-11-10 NOTE — ED Notes (Signed)
Pt reports  r  Upper quadrant  Abdominal  Pain     With  Some      Nausea   As  Well     Pt  Has  A  History of gallstones in  Past

## 2015-11-10 NOTE — ED Notes (Signed)
Pt  Advised  Remain npo   

## 2015-11-10 NOTE — ED Notes (Signed)
[facility] with [facility].  Patient has been seen before for cholecystis without obstruction.  Patient continues with abdominal pain on the right side, nausea, no vomiting.  Patient is CAOx4 in triage.  [facility] and nausea has resolved at this time.

## 2015-11-10 NOTE — ED Provider Notes (Signed)
CSN: [id number]     Arrival date & time 11/10/15  1905 History   First MD Initiated Contact with Patient 11/10/15 1922     No chief complaint on file.  (Consider location/radiation/quality/duration/timing/severity/associated sxs/prior Treatment) Patient is a 30 y.o. female presenting with abdominal pain. The history is provided by the patient.  Abdominal Pain Pain location:  RUQ Pain quality: cramping   Pain radiates to:  Back Pain severity:  Moderate Onset quality:  Sudden Duration:  3 hours Timing:  Constant Progression:  Worsening Chronicity:  Recurrent Context: diet changes   Context comment:  Prob related to pizza eaten today. Associated symptoms: nausea   Associated symptoms: no constipation, no fever and no vomiting   Associated symptoms comment:  Pain is 10/10 currently. Risk factors: obesity   Risk factors: not pregnant   Risk factors comment:  Dx in nov with gb and seen by gastro in may for re-eval.   Past Medical History  Diagnosis Date  . Gastritis   . ADD (attention deficit disorder)   . Anxiety state 09/07/2015  . Allergy    Past Surgical History  Procedure Laterality Date  . Tooth extraction  2000   Family History  Problem Relation Age of Onset  . Diabetes Mother   . Diabetes Father   . Hyperlipidemia Brother   . Heart disease Maternal Aunt   . Diabetes Paternal Uncle    Social History  Substance Use Topics  . Smoking status: Never Smoker   . Smokeless tobacco: Never Used  . Alcohol Use: No   OB History    No data available     Review of Systems  Constitutional: Negative.  Negative for fever.  Gastrointestinal: Positive for nausea and abdominal pain. Negative for vomiting, constipation and abdominal distention.  All other systems reviewed and are negative.   Allergies  Milk-related compounds  Home Medications   Prior to Admission medications   Medication Sig Start Date End Date Taking? Authorizing Provider  acetaminophen (TYLENOL) 500  MG tablet Take 500-1,000 mg by mouth every 6 (six) hours as needed for mild pain.    Historical Provider, MD  cetirizine (ZYRTEC) 10 MG tablet Take 10 mg by mouth daily.    Historical Provider, MD  Multiple Vitamin (MULTIVITAMIN) tablet Take 1 tablet by mouth daily.    Historical Provider, MD  omeprazole (PRILOSEC) 20 MG capsule Take 1 capsule (20 mg total) by mouth 2 (two) times daily before a meal. Patient taking differently: Take 20 mg by mouth 2 (two) times daily as needed (acid reflux).  11/05/14   Blake DivineJohn Wofford, MD   Meds Ordered and Administered this Visit   Medications  HYDROmorphone (DILAUDID) injection 2 mg (2 mg Intramuscular Given 11/10/15 1938)  ondansetron (ZOFRAN-ODT) disintegrating tablet 4 mg (4 mg Oral Given 11/10/15 1938)    There were no vitals taken for this visit. No data found.   Physical Exam  Constitutional: She is oriented to person, place, and time. She appears well-developed and well-nourished. No distress.  Abdominal: Soft. She exhibits no distension and no mass. Bowel sounds are decreased. There is no hepatosplenomegaly. There is tenderness in the right upper quadrant. There is positive Murphy's sign. There is no rigidity, no rebound, no guarding, no CVA tenderness and no tenderness at McBurney's point.    Neurological: She is alert and oriented to person, place, and time.  Skin: Skin is warm and dry.  Nursing note and vitals reviewed.   ED Course  Procedures (including critical care  time)  Labs Review Labs Reviewed - No data to display  Imaging Review No results found.   Visual Acuity Review  Right Eye Distance:   Left Eye Distance:   Bilateral Distance:    Right Eye Near:   Left Eye Near:    Bilateral Near:         MDM   1. Colicky RUQ abdominal pain    Sent for eval of prob acute chole. In pt with known gb problem.    Linna HoffJames D Cherell Colvin, MD 11/10/15 [phone]

## 2015-11-11 ENCOUNTER — Emergency Department (HOSPITAL_COMMUNITY): Payer: 59

## 2015-11-11 MED ORDER — MORPHINE SULFATE (PF) 4 MG/ML IV SOLN
4.0000 mg | Freq: Once | INTRAVENOUS | Status: AC
  Administered 2015-11-11: 4 mg via INTRAVENOUS
  Filled 2015-11-11: qty 1

## 2015-11-11 MED ORDER — HYDROCODONE-ACETAMINOPHEN 5-325 MG PO TABS: 1.0000 | ORAL_TABLET | Freq: Four times a day (QID) | ORAL | Status: DC | PRN

## 2015-11-11 MED ORDER — ONDANSETRON HCL 4 MG/2ML IJ SOLN
4.0000 mg | Freq: Once | INTRAMUSCULAR | Status: AC
  Administered 2015-11-11: 4 mg via INTRAVENOUS
  Filled 2015-11-11: qty 2

## 2015-11-11 MED ORDER — ONDANSETRON 4 MG PO TBDP: 4.0000 mg | ORAL_TABLET | Freq: Three times a day (TID) | ORAL | Status: DC | PRN

## 2015-11-11 NOTE — ED Notes (Signed)
Patient transported to Ultrasound 

## 2015-11-11 NOTE — Discharge Instructions (Signed)
Biliary Colic °Biliary colic is a pain in the upper abdomen. The pain: °· Is usually felt on the right side of the abdomen, but it may also be felt in the center of the abdomen, just below the breastbone (sternum). °· May spread back toward the right shoulder blade. °· May be steady or irregular. °· May be accompanied by nausea and vomiting. °Most of the time, the pain goes away in 1-5 hours. After the most intense pain passes, the abdomen may continue to ache mildly for about 24 hours. °Biliary colic is caused by a blockage in the bile duct. The bile duct is a pathway that carries bile--a liquid that helps to digest fats--from the gallbladder to the small intestine. Biliary colic usually occurs after eating, when the digestive system demands bile. The pain develops when muscle cells contract forcefully to try to move the blockage so that bile can get by. °HOME CARE INSTRUCTIONS °· Take medicines only as directed by your health care provider. °· Drink enough fluid to keep your urine clear or pale yellow. °· Avoid fatty, greasy, and fried foods. These kinds of foods increase your body's demand for bile. °· Avoid any foods that make your pain worse. °· Avoid overeating. °· Avoid having a large meal after fasting. °SEEK MEDICAL CARE IF: °· You develop a fever. °· Your pain gets worse. °· You vomit. °· You develop nausea that prevents you from eating and drinking. °SEEK IMMEDIATE MEDICAL CARE IF: °· You suddenly develop a fever and shaking chills. °· You develop a yellowish discoloration (jaundice) of: °¨ Skin. °¨ Whites of the eyes. °¨ Mucous membranes. °· You have continuous or severe pain that is not relieved with medicines. °· You have nausea and vomiting that is not relieved with medicines. °· You develop dizziness or you faint. °  °This information is not intended to replace advice given to you by your health care provider. Make sure you discuss any questions you have with your health care provider. °  °Document  Released: 09/23/2005 Document Revised: 09/06/2014 Document Reviewed: 02/01/2014 °Elsevier Interactive Patient Education ©2016 Elsevier Inc. ° °

## 2015-11-11 NOTE — ED Provider Notes (Signed)
CSN: [id number]     Arrival date & time 11/10/15  2007 History  By signing my name below, I, Valerie Green, attest that this documentation has been prepared under the direction and in the presence of Shon Batonourtney F Horton, MD. Electronically Signed: Phillis HaggisGabriella Green, ED Scribe. 11/11/2015. 2:36 AM.    Chief Complaint  Patient presents with  . Abdominal Pain   The history is provided by the patient. No language interpreter was used.  HPI Comments: Valerie Green is a 30 y.o. Female with a hx of gastritis and gallstones who presents to the Emergency Department complaining of gradually worsening, recurrent, shooting RUQ abdominal pain onset earlier today. She currently rates her pain 4/10. She reports that she took omeprazole before eating slices of pizza, but this did not help prevent abdominal pain.She reports associated nausea. She was seen at Urgent Care for the same today and given medication to relief. She states that the medication has since worn off.  Pt states that she had similar symptoms in the past year when she was diagnosed with gallstones. She has followed up with a GI doctor, but not a general surgeon for her gallstones. She denies fever, diarrhea, and vomiting. Pt takes omeprazole daily.   Past Medical History  Diagnosis Date  . Gastritis   . ADD (attention deficit disorder)   . Anxiety state 09/07/2015  . Allergy    Past Surgical History  Procedure Laterality Date  . Tooth extraction  2000   Family History  Problem Relation Age of Onset  . Diabetes Mother   . Diabetes Father   . Hyperlipidemia Brother   . Heart disease Maternal Aunt   . Diabetes Paternal Uncle    Social History  Substance Use Topics  . Smoking status: Never Smoker   . Smokeless tobacco: Never Used  . Alcohol Use: No   OB History    No data available     Review of Systems  Constitutional: Negative for fever.  Gastrointestinal: Positive for nausea and abdominal pain. Negative for vomiting and  diarrhea.  All other systems reviewed and are negative.  Allergies  Other and Milk-related compounds  Home Medications   Prior to Admission medications   Medication Sig Start Date End Date Taking? Authorizing Provider  acetaminophen (TYLENOL) 500 MG tablet Take 500-1,000 mg by mouth every 6 (six) hours as needed for mild pain.   Yes Historical Provider, MD  omeprazole (PRILOSEC) 20 MG capsule Take 1 capsule (20 mg total) by mouth 2 (two) times daily before a meal. 11/05/14  Yes Blake DivineJohn Wofford, MD  HYDROcodone-acetaminophen (NORCO/VICODIN) 5-325 MG tablet Take 1-2 tablets by mouth every 6 (six) hours as needed. 11/11/15   Shon Batonourtney F Horton, MD  ondansetron (ZOFRAN ODT) 4 MG disintegrating tablet Take 1 tablet (4 mg total) by mouth every 8 (eight) hours as needed for nausea or vomiting. 11/11/15   Shon Batonourtney F Horton, MD   BP 120/64 mmHg  Pulse 46  Temp(Src) 98.1 F (36.7 C) (Oral)  Resp 16  Ht 5\' 4"  (1.626 m)  Wt 298 lb (135.172 kg)  BMI 51.13 kg/m2  SpO2 100%  LMP 11/04/2015 (Exact Date) Physical Exam  Constitutional: She is oriented to person, place, and time. She appears well-developed and well-nourished.  Overweight  HENT:  Head: Normocephalic and atraumatic.  Cardiovascular: Normal rate, regular rhythm and normal heart sounds.   No murmur heard. Pulmonary/Chest: Effort normal and breath sounds normal. No respiratory distress. She has no wheezes.  Abdominal: Soft. Bowel sounds  are normal. There is tenderness. There is no rebound and no guarding.  Epigastric and right upper quadrant tenderness to palpation without rebound or guarding, negative Murphy's  Neurological: She is alert and oriented to person, place, and time.  Skin: Skin is warm and dry.  Psychiatric: She has a normal mood and affect.  Nursing note and vitals reviewed.   ED Course  Procedures (including critical care time) DIAGNOSTIC STUDIES: Oxygen Saturation is 100% on RA, normal by my interpretation.     COORDINATION OF CARE: 12:20 AM-Discussed treatment plan which includes labs and [place] with pt at bedside and pt agreed to plan.    Labs Review Labs Reviewed  COMPREHENSIVE METABOLIC PANEL - Abnormal; Notable for the following:    Glucose, Bld 138 (*)    Total Bilirubin <0.1 (*)    All other components within normal limits  URINALYSIS, ROUTINE W REFLEX MICROSCOPIC (NOT AT [facility]) - Abnormal; Notable for the following:    Specific Gravity, Urine 1.031 (*)    Hgb urine dipstick TRACE (*)    All other components within normal limits  URINE MICROSCOPIC-ADD ON - Abnormal; Notable for the following:    Squamous Epithelial / LPF 0-5 (*)    Bacteria, UA RARE (*)    All other components within normal limits  LIPASE, BLOOD  CBC    Imaging Review US Abdomen Limited Ruq  11/11/2015  CLINICAL DATA:  RIGHT upper quadrant pain beginning at 4 p.m. History of cholelithiasis. EXAM: US ABDOMEN LIMITED - RIGHT UPPER QUADRANT COMPARISON:  Limited abdominal sonogram March 09, 2015 FINDINGS: Gallbladder: Multiple echogenic gallstones measure up to 1.5 cm, with acoustic shadowing. No gallbladder distention. No gallbladder wall thickening or pericholecystic fluid. No sonographic Murphy's sign elicited. Common bile duct: Diameter: 5 mm Liver: No focal lesion identified. Normal echogenicity. No intrahepatic biliary dilatation. No perihepatic free fluid. Hepatopetal portal vein. IMPRESSION: Cholelithiasis without sonographic findings of acute cholecystitis. Electronically Signed   By: Awilda Metro M.D.   On: 11/11/2015 02:06   I have personally reviewed and evaluated these images and lab results as part of my medical decision-making.   EKG Interpretation None      MDM   Final diagnoses:  Abdominal pain  Biliary colic    Patient presents with recurrent abdominal pain. History of gallstones. Reports worsening of symptoms tonight. Last ultrasound was in November 2016. She's tender without signs of  peritonitis on exam. Vital signs reassuring. Patient was given pain and nausea medication. Repeat ultrasound obtained and shows evidence of gallstones without evidence of cholecystitis.  Lab work is reassuring. Patient is able to tolerate fluids. Will discharge him with pain and nausea medication. Follow-up general surgery for elective cholecystectomy.  After history, exam, and medical workup I feel the patient has been appropriately medically screened and is safe for discharge home. Pertinent diagnoses were discussed with the patient. Patient was given return precautions.  I personally performed the services described in this documentation, which was scribed in my presence. The recorded information has been reviewed and is accurate.      Shon Baton, MD 11/11/15 [phone]

## 2015-11-29 ENCOUNTER — Ambulatory Visit (HOSPITAL_COMMUNITY)
Admission: EM | Admit: 2015-11-29 | Discharge: 2015-11-29 | Disposition: A | Discharge status: Home / Self Care | Payer: 59 | Attending: Family Medicine | Admitting: Family Medicine

## 2015-11-29 ENCOUNTER — Other Ambulatory Visit: Payer: Self-pay | Admitting: Surgery

## 2015-11-29 ENCOUNTER — Encounter (HOSPITAL_COMMUNITY): Payer: Self-pay | Admitting: *Deleted

## 2015-11-29 DIAGNOSIS — J358 Other chronic diseases of tonsils and adenoids: Secondary | ICD-10-CM | POA: Diagnosis not present

## 2015-11-29 HISTORY — DX: Prediabetes: R73.03

## 2015-11-29 NOTE — ED Triage Notes (Signed)
Patient reports tongue irritation since Friday and noted white pus pocket to left tonsil yesterday, no fever no sore throat.

## 2015-11-29 NOTE — ED Provider Notes (Signed)
[facility]    CSN: [id number] Arrival date & time: 11/29/15  1052  First Provider Contact:  None       History   Chief Complaint Chief Complaint  Patient presents with  . Sore Throat    HPI Valerie Green is a 30 y.o. female.    Sore Throat  This is a new problem. The current episode started 2 days ago (white spot on left tonsil.). The problem has not changed since onset.Pertinent negatives include no chest pain and no abdominal pain. She has tried nothing for the symptoms.    Past Medical History:  Diagnosis Date  . ADD (attention deficit disorder)   . Allergy   . Anxiety state 09/07/2015  . Gastritis   . Pre-diabetes     Patient Active Problem List   Diagnosis Date Noted  . Anxiety state 09/07/2015    Past Surgical History:  Procedure Laterality Date  . TOOTH EXTRACTION  2000    OB History    No data available       Home Medications    Prior to Admission medications   Medication Sig Start Date End Date Taking? Authorizing Provider  omeprazole (PRILOSEC) 20 MG capsule Take 1 capsule (20 mg total) by mouth 2 (two) times daily before a meal. 11/05/14  Yes Blake Divine, MD  acetaminophen (TYLENOL) 500 MG tablet Take 500-1,000 mg by mouth every 6 (six) hours as needed for mild pain.    Historical Provider, MD  HYDROcodone-acetaminophen (NORCO/VICODIN) 5-325 MG tablet Take 1-2 tablets by mouth every 6 (six) hours as needed. 11/11/15   Shon Baton, MD  ondansetron (ZOFRAN ODT) 4 MG disintegrating tablet Take 1 tablet (4 mg total) by mouth every 8 (eight) hours as needed for nausea or vomiting. 11/11/15   Shon Baton, MD    Family History Family History  Problem Relation Age of Onset  . Diabetes Mother   . Diabetes Father   . Hyperlipidemia Brother   . Heart disease Maternal Aunt   . Diabetes Paternal Uncle     Social History Social History  Substance Use Topics  . Smoking status: Never Smoker  . Smokeless tobacco: Never Used  .  Alcohol use No     Allergies   Other and Milk-related compounds   Review of Systems Review of Systems  Constitutional: Negative.   HENT: Positive for sore throat. Negative for congestion, drooling, postnasal drip and rhinorrhea.   Cardiovascular: Negative for chest pain.  Gastrointestinal: Negative for abdominal pain.  Hematological: Negative for adenopathy.  All other systems reviewed and are negative.    Physical Exam Triage Vital Signs ED Triage Vitals [11/29/15 1144]  Enc Vitals Group     BP      Pulse Rate 70     Resp 16     Temp 98.6 F (37 C)     Temp Source Oral     SpO2 100 %     Weight      Height      Head Circumference      Peak Flow      Pain Score      Pain Loc      Pain Edu?      Excl. in GC?    No data found.   Updated Vital Signs Pulse 70   Temp 98.6 F (37 C) (Oral)   Resp 16   LMP 11/04/2015 (Exact Date)   SpO2 100%   Visual Acuity Right Eye  Distance:   Left Eye Distance:   Bilateral Distance:    Right Eye Near:   Left Eye Near:    Bilateral Near:     Physical Exam  Constitutional: She appears well-developed and well-nourished.  HENT:  Right Ear: External ear normal.  Left Ear: External ear normal.  Nose: Nose normal.  Shiny white concretion left tonsillar pit removed with tongue blade without diff-resolved.  Eyes: Pupils are equal, round, and reactive to light.  Neck: Normal range of motion. Neck supple.  Lymphadenopathy:    She has no cervical adenopathy.  Nursing note and vitals reviewed.    UC Treatments / Results  Labs (all labs ordered are listed, but only abnormal results are displayed) Labs Reviewed - No data to display  EKG  EKG Interpretation None       Radiology No results found.  Procedures Procedures (including critical care time)  Medications Ordered in UC Medications - No data to display   Initial Impression / Assessment and Plan / UC Course  I have reviewed the triage vital signs and  the nursing notes.  Pertinent labs & imaging results that were available during my care of the patient were reviewed by me and considered in my medical decision making (see chart for details).  Clinical Course      Final Clinical Impressions(s) / UC Diagnoses   Final diagnoses:  Tonsillar debris    New Prescriptions New Prescriptions   No medications on file     Linna Hoff, MD 11/29/15 1221

## 2015-11-29 NOTE — Discharge Instructions (Signed)
Salt gargle as needed. Return if any problems.

## 2015-12-19 ENCOUNTER — Encounter (HOSPITAL_COMMUNITY): Payer: Self-pay

## 2015-12-19 ENCOUNTER — Encounter (HOSPITAL_COMMUNITY)
Admission: RE | Admit: 2015-12-19 | Discharge: 2015-12-19 | Disposition: A | Discharge status: Home / Self Care | Payer: 59 | Source: Ambulatory Visit | Attending: Surgery | Admitting: Surgery

## 2015-12-19 DIAGNOSIS — K8010 Calculus of gallbladder with chronic cholecystitis without obstruction: Secondary | ICD-10-CM | POA: Diagnosis present

## 2015-12-19 DIAGNOSIS — R739 Hyperglycemia, unspecified: Secondary | ICD-10-CM | POA: Diagnosis not present

## 2015-12-19 DIAGNOSIS — Z87891 Personal history of nicotine dependence: Secondary | ICD-10-CM | POA: Diagnosis not present

## 2015-12-19 DIAGNOSIS — Z79899 Other long term (current) drug therapy: Secondary | ICD-10-CM | POA: Diagnosis not present

## 2015-12-19 DIAGNOSIS — K219 Gastro-esophageal reflux disease without esophagitis: Secondary | ICD-10-CM | POA: Diagnosis not present

## 2015-12-19 DIAGNOSIS — Z6841 Body Mass Index (BMI) 40.0 and over, adult: Secondary | ICD-10-CM | POA: Diagnosis not present

## 2015-12-19 HISTORY — DX: Headache, unspecified: R51.9

## 2015-12-19 HISTORY — DX: Anemia, unspecified: D64.9

## 2015-12-19 HISTORY — DX: Gastro-esophageal reflux disease without esophagitis: K21.9

## 2015-12-19 HISTORY — DX: Headache: R51

## 2015-12-19 HISTORY — DX: Pneumonia, unspecified organism: J18.9

## 2015-12-19 HISTORY — DX: Cardiac arrhythmia, unspecified: I49.9

## 2015-12-19 LAB — COMPREHENSIVE METABOLIC PANEL
ALBUMIN: 3.7 g/dL (ref 3.5–5.0)
ALT: 17 U/L (ref 14–54)
AST: 26 U/L (ref 15–41)
Alkaline Phosphatase: 42 U/L (ref 38–126)
Anion gap: 6 (ref 5–15)
BUN: 8 mg/dL (ref 6–20)
CHLORIDE: 106 mmol/L (ref 101–111)
CO2: 25 mmol/L (ref 22–32)
CREATININE: 0.91 mg/dL (ref 0.44–1.00)
Calcium: 9 mg/dL (ref 8.9–10.3)
GFR calc Af Amer: >60 mL/min (ref >60)
GLUCOSE: 104 mg/dL — AB (ref 65–99)
Potassium: 3.9 mmol/L (ref 3.5–5.1)
Sodium: 137 mmol/L (ref 135–145)
Total Bilirubin: 0.3 mg/dL (ref 0.3–1.2)
Total Protein: 7.3 g/dL (ref 6.5–8.1)

## 2015-12-19 LAB — CBC WITH DIFFERENTIAL/PLATELET
BASOS ABS: 0 10*3/uL (ref 0.0–0.1)
BASOS PCT: 0 %
EOS PCT: 1 %
Eosinophils Absolute: 0.1 10*3/uL (ref 0.0–0.7)
HCT: 37.2 % (ref 36.0–46.0)
Hemoglobin: 12.5 g/dL (ref 12.0–15.0)
LYMPHS PCT: 51 %
Lymphs Abs: 3.2 10*3/uL (ref 0.7–4.0)
MCH: 28.7 pg (ref 26.0–34.0)
MCHC: 33.6 g/dL (ref 30.0–36.0)
MCV: 85.5 fL (ref 78.0–100.0)
Monocytes Absolute: 0.3 10*3/uL (ref 0.1–1.0)
Monocytes Relative: 5 %
Neutro Abs: 2.7 10*3/uL (ref 1.7–7.7)
Neutrophils Relative %: 43 %
PLATELETS: 250 10*3/uL (ref 150–400)
RBC: 4.35 MIL/uL (ref 3.87–5.11)
RDW: 12.5 % (ref 11.5–15.5)
WBC: 6.3 10*3/uL (ref 4.0–10.5)

## 2015-12-19 LAB — HCG, SERUM, QUALITATIVE: PREG SERUM: NEGATIVE

## 2015-12-19 NOTE — Pre-Procedure Instructions (Signed)
Valerie Green  12/19/2015      [name]- [place], [place] - [address] [place] [address] Phone: [phone] Fax: [phone]  [facility] [id number] [name]- [place] - [id number] [facility] [address] [facility] [address] [place] [address] Phone: [phone] Fax: [phone]    Your procedure is scheduled on Aug. 18  [facility] Admitting at 1100 A.M.  Call this number if you have problems the morning of surgery:  [phone]   Remember:  Do not eat food or drink liquids after midnight.  Take these medicines the morning of surgery with A SIP OF WATER Tylenol  Or Hydrocodone (Norco)if needed,Omeprazole (Prilosec)  Stop taking aspirin, BC's, Goody's, herbal medications, Fish Oil, Ibuprofen, Advil, Motrin, Aleve, Vitamins   Do not wear jewelry, make-up or nail polish.  Do not wear lotions, powders, or perfumes.  You may wear deoderant.  Do not shave 48 hours prior to surgery.  Men may shave face and neck.  Do not bring valuables to the hospital.  [facility] is not responsible for any belongings or valuables.  Contacts, dentures or bridgework may not be worn into surgery.  Leave your suitcase in the car.  After surgery it may be brought to your room.  For patients admitted to the hospital, discharge time will be determined by your treatment team.  Patients discharged the day of surgery will not be allowed to drive home.    Special instructions:  [facility] - Preparing for Surgery  Before surgery, you can play an important role.  Because skin is not sterile, your skin needs to be as free of germs as possible.  You can reduce the number of germs on you skin by washing with CHG (chlorahexidine gluconate) soap before surgery.  CHG is an antiseptic cleaner which kills germs and bonds with the skin to continue killing germs even after washing.  Please DO NOT use if  you have an allergy to CHG or antibacterial soaps.  If your skin becomes reddened/irritated stop using the CHG and inform your nurse when you arrive at Short Stay.  Do not shave (including legs and underarms) for at least 48 hours prior to the first CHG shower.  You may shave your face.  Please follow these instructions carefully:   1.  Shower with CHG Soap the night before surgery and the   morning of Surgery.  2.  If you choose to wash your hair, wash your hair first as usual with your normal shampoo.  3.  After you shampoo, rinse your hair and body thoroughly to remove the                      Shampoo.  4.  Use CHG as you would any other liquid soap.  You can apply chg directly       to the skin and wash gently with scrungie or a clean washcloth.  5.  Apply the CHG Soap to your body ONLY FROM THE NECK DOWN.        Do not use on open wounds or open sores.  Avoid contact with your eyes,       ears, mouth and genitals (private parts).  Wash genitals (private parts)       with your normal soap.  6.  Wash thoroughly, paying special attention to the area where  your surgery        will be performed.  7.  Thoroughly rinse your body with warm water from the neck down.  8.  DO NOT shower/wash with your normal soap after using and rinsing off       the CHG Soap.  9.  Pat yourself dry with a clean towel.            10.  Wear clean pajamas.            11.  Place clean sheets on your bed the night of your first shower and do not        sleep with pets.  Day of Surgery  Do not apply any lotions/deoderants the morning of surgery.  Please wear clean clothes to the hospital/surgery center.     Please read over the following fact sheets that you were given. Pain Booklet, Coughing and Deep Breathing and Surgical Site Infection Prevention

## 2015-12-19 NOTE — Progress Notes (Signed)
Denies having a PCP. Denies ever seeing a cardiologist. Denies ever having a card cath, stress test, or echo.

## 2015-12-20 LAB — HEMOGLOBIN A1C
HEMOGLOBIN A1C: 5.9 % — AB (ref 4.8–5.6)
MEAN PLASMA GLUCOSE: 123 mg/dL

## 2015-12-21 MED ORDER — CEFAZOLIN SODIUM 10 G IJ SOLR
3.0000 g | INTRAMUSCULAR | Status: AC
  Administered 2015-12-22: 3 g via INTRAVENOUS
  Filled 2015-12-21: qty 3000

## 2015-12-21 NOTE — H&P (Signed)
Valerie Green  Location: [facility] Patient #: [id number] DOB: 06/15/1985 Single / Language: [place] / Race: Black or African American Female  History of Present Illness   The patient is a 30 year old female who presents with a complaint of gall bladder disease.   The PCP is none.  The patient was referred by Dr. [name]. Horton.  She is accompanied by her mother, Valerie Green.  The patient has had symptoms of abdominal pain going for almost a year. She has made several parents as to the emergency room to evaluate this abdominal pain. It is sometimes related to food, but sometimes not. She says that tomatoes, citrus, and onions make her symptoms worse. She has been seen in the emergency room on 04 November 2014, on 09 March 2015, on 12 May 2015, and the 10 November 2015 for abdominal symptoms. She did have an ultrasound of 09 March 2015 which showed gallstones. She appeared to be unaware of these results.  She [facility] and then [facility] on 11 November 2015. An ultrasound showed multiple gallstones. Her labs showed normal liver functions and she was asked to follow up in our office. On her labs she has shown a persistently elevated blood sugar between 126-143.  I discussed with the patient the indications and risks of gall bladder surgery. The primary risks of gall bladder surgery include, but are not limited to, bleeding, infection, common bile duct injury, and open surgery. There is also the risk that the patient may have continued symptoms after surgery. We discussed the typical post-operative recovery course. I tried to answer the patient's questions. I gave the patient literature about gall bladder surgery.   Past Medical History: 1. Morbid Obesity - I talked to her about working on wieght loss. 2. Elevated blood sugars on multiple labs over the last  year. I gave her copies of her reports, told her she needs to find a PCP. Phe could have pre diabetes.  Social History: Unmarried. Lives with parents. Her mother, Valerie Green, is here with her today. 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    Other Problems Fay Records(Ashley Beck, CMA; 11/29/2015 9:40 AM) Anxiety Disorder Back Pain Bladder Problems Cholelithiasis Migraine Headache Other disease, cancer, significant illness  Past Surgical History Fay Records(Ashley Beck, CMA; 11/29/2015 9:40 AM) Oral Surgery  Diagnostic Studies History Fay Records(Ashley Beck, [place]; 11/29/2015 9:40 AM) Colonoscopy never Mammogram never Pap Smear >5 years ago  Allergies Fay Records(Ashley Beck, CMA; 11/29/2015 9:40 AM) Milk  Medication History Fay Records(Ashley Beck, CMA; 11/29/2015 9:41 AM) Hydrocodone-Guaifenesin (5-300MG  Tablet, Oral) Active. Omeprazole (20MG  Capsule DR, Oral) Active. Medications Reconciled  Social History Fay Records(Ashley Beck, [place]; 11/29/2015 9:40 AM) Alcohol use Occasional alcohol use. Caffeine use Tea. No drug use Tobacco use Former smoker.  Family History Fay Records(Ashley Beck, [place]; 11/29/2015 9:40 AM) Depression Mother.  Pregnancy / Birth History Fay Records(Ashley Beck, CMA; 11/29/2015 9:40 AM) Age at menarche 11 years. Gravida 0 Para 0 Regular periods    Review of Systems Fay Records(Ashley Beck CMA; 11/29/2015 9:40 AM) General Present- Appetite Loss. Not Present- Chills, Fatigue, Fever, Night Sweats, Weight Gain and Weight Loss. Skin Not Present- Change in Wart/Mole, Dryness, Hives, Jaundice, New Lesions, Non-Healing Wounds, Rash and Ulcer. HEENT Not Present- Earache, Hearing Loss, Hoarseness, Nose Bleed, Oral Ulcers, Ringing in the Ears, Seasonal Allergies, Sinus Pain, Sore Throat, Visual Disturbances, Wears glasses/contact lenses and Yellow Eyes. Respiratory Present- Difficulty Breathing. Not Present- Bloody sputum, Chronic Cough,  Snoring and Wheezing. Breast Not Present- Breast Mass, Breast Pain, Nipple Discharge and  Skin Changes. Cardiovascular Present- Difficulty Breathing Lying Down, Palpitations, Rapid Heart Rate and Shortness of Breath. Not Present- Chest Pain, Leg Cramps and Swelling of Extremities. Gastrointestinal Present- Abdominal Pain, Bloating, Constipation, Gets full quickly at meals, Indigestion and Nausea. Not Present- Bloody Stool, Change in Bowel Habits, Chronic diarrhea, Difficulty Swallowing, Excessive gas, Hemorrhoids, Rectal Pain and Vomiting. Female Genitourinary Not Present- Frequency, Nocturia, Painful Urination, Pelvic Pain and Urgency. Musculoskeletal Present- Back Pain and Swelling of Extremities. Not Present- Joint Pain, Joint Stiffness, Muscle Pain and Muscle Weakness. Neurological Present- Headaches and Weakness. Not Present- Decreased Memory, Fainting, Numbness, Seizures, Tingling, Tremor and Trouble walking. Psychiatric Not Present- Anxiety, Bipolar, Change in Sleep Pattern, Depression, Fearful and Frequent crying. Endocrine Not Present- Cold Intolerance, Excessive Hunger, Hair Changes, Heat Intolerance, Hot flashes and New Diabetes. Hematology Not Present- Blood Thinners, Easy Bruising, Excessive bleeding, Gland problems, HIV and Persistent Infections.  Vitals Fay Records(Ashley Beck CMA; 11/29/2015 9:41 AM) 11/29/2015 9:41 AM Weight: 306 lb Height: 64in Body Surface Area: 2.34 m Body Mass Index: 52.52 kg/m  Temp.: 98.41F  Pulse: 78 (Regular)  BP: 130/70 (Sitting, Left Arm, Standard)   Physical Exam  General: Obese AA F alert and generally healthy appearing. Skin: Inspection and palpation of the skin unremarkable. She has tattoos on her right leg.  Eyes: Conjunctivae white, pupils equal. Face, ears, nose, mouth, and throat: Face - normal. Normal ears and nose. Lips and teeth normal.  Neck: Supple. No mass. Trachea midline. No thyroid mass. Lymph Nodes: No supraclavicular or cervical adenopathy.  Lungs: Normal respiratory effort. Clear to auscultation and symmetric  breath sounds. Cardiovascular: Regular rate and rythm. Normal auscultation of the heart. No murmur or rub. Normal carotid pulse.  Abdomen: Soft. No mass. Liver and spleen not palpable. No hernia. Normal bowel sounds. No abdominal scars. She has soreness in her RUQ. But no guarding or peritoneal sxes.  Musculoskeletal/extremities: Normal gait. Good strength and ROM in upper and lower extremities. Digits and nails are unremarkable.  Neurologic: Grossly intact to motor and sensory function. No obvious deficit in the cranial nerves. Psychiatric: Has normal mood and affect. Judgement and insight appear normal.  Assessment & Plan  1.  GALL BLADDER DISEASE (K82.9)  Plan:  1) Laparoscopic cholecystectomy (elective major surgery with no identified risk factors) - information given to patient.  2.  HYPERGLYCEMIA (R73.9)  Impression: Plan:  Recommend getting a primary care physician to follow elevated blood sugars 3.  MORBID OBESITY WITH BMI OF 50.0-59.9, ADULT (E66.01)   Ovidio Kinavid Sotirios Navarro, MD, [facility] Pager: [phone] Office phone:  [phone]

## 2015-12-22 ENCOUNTER — Ambulatory Visit (HOSPITAL_COMMUNITY): Payer: 59 | Admitting: Certified Registered"

## 2015-12-22 ENCOUNTER — Encounter (HOSPITAL_COMMUNITY): Payer: Self-pay | Admitting: *Deleted

## 2015-12-22 ENCOUNTER — Encounter (HOSPITAL_COMMUNITY)
Admission: RE | Disposition: A | Discharge status: Home / Self Care | Payer: Self-pay | Source: Ambulatory Visit | Attending: Surgery

## 2015-12-22 ENCOUNTER — Ambulatory Visit (HOSPITAL_COMMUNITY): Payer: 59

## 2015-12-22 ENCOUNTER — Observation Stay (HOSPITAL_COMMUNITY)
Admission: RE | Admit: 2015-12-22 | Discharge: 2015-12-23 | Disposition: A | Discharge status: Home / Self Care | Payer: 59 | Source: Ambulatory Visit | Attending: Surgery | Admitting: Surgery

## 2015-12-22 DIAGNOSIS — K819 Cholecystitis, unspecified: Secondary | ICD-10-CM | POA: Diagnosis present

## 2015-12-22 DIAGNOSIS — R739 Hyperglycemia, unspecified: Secondary | ICD-10-CM | POA: Insufficient documentation

## 2015-12-22 DIAGNOSIS — K219 Gastro-esophageal reflux disease without esophagitis: Secondary | ICD-10-CM | POA: Insufficient documentation

## 2015-12-22 DIAGNOSIS — Z79899 Other long term (current) drug therapy: Secondary | ICD-10-CM | POA: Insufficient documentation

## 2015-12-22 DIAGNOSIS — K8010 Calculus of gallbladder with chronic cholecystitis without obstruction: Principal | ICD-10-CM | POA: Insufficient documentation

## 2015-12-22 DIAGNOSIS — Z419 Encounter for procedure for purposes other than remedying health state, unspecified: Secondary | ICD-10-CM

## 2015-12-22 DIAGNOSIS — Z87891 Personal history of nicotine dependence: Secondary | ICD-10-CM | POA: Insufficient documentation

## 2015-12-22 DIAGNOSIS — Z9049 Acquired absence of other specified parts of digestive tract: Secondary | ICD-10-CM

## 2015-12-22 DIAGNOSIS — Z6841 Body Mass Index (BMI) 40.0 and over, adult: Secondary | ICD-10-CM | POA: Insufficient documentation

## 2015-12-22 HISTORY — PX: LAPAROSCOPIC CHOLECYSTECTOMY SINGLE SITE WITH INTRAOPERATIVE CHOLANGIOGRAM: SHX6538

## 2015-12-22 LAB — BASIC METABOLIC PANEL
BUN: 8 mg/dL (ref 4–21)
CREATININE: 0.9 mg/dL (ref 0.5–1.1)
Glucose: 104 mg/dL
Potassium: 3.9 mmol/L (ref 3.4–5.3)
Sodium: 137 mmol/L (ref 137–147)

## 2015-12-22 LAB — POCT I-STAT 4, (NA,K, GLUC, HGB,HCT)
GLUCOSE: 130 mg/dL — AB (ref 65–99)
HEMATOCRIT: 37 % (ref 36.0–46.0)
HEMOGLOBIN: 12.6 g/dL (ref 12.0–15.0)
POTASSIUM: 4.7 mmol/L (ref 3.5–5.1)
SODIUM: 146 mmol/L — AB (ref 135–145)

## 2015-12-22 LAB — HEPATIC FUNCTION PANEL
ALT: 17 U/L (ref 7–35)
AST: 26 U/L (ref 13–35)
BILIRUBIN, TOTAL: 0.3 mg/dL

## 2015-12-22 LAB — CBC AND DIFFERENTIAL
HEMATOCRIT: 37 % (ref 36–46)
HEMOGLOBIN: 12.5 g/dL (ref 12.0–16.0)
PLATELETS: 250 10*3/uL (ref 150–399)
WBC: 6.3 10*3/mL

## 2015-12-22 LAB — HEMOGLOBIN A1C: HEMOGLOBIN A1C: 5.9

## 2015-12-22 SURGERY — LAPAROSCOPIC CHOLECYSTECTOMY SINGLE SITE WITH INTRAOPERATIVE CHOLANGIOGRAM
Anesthesia: General | Site: Abdomen

## 2015-12-22 MED ORDER — OXYCODONE HCL 5 MG PO TABS
5.0000 mg | ORAL_TABLET | Freq: Once | ORAL | Status: AC | PRN
  Administered 2015-12-22: 5 mg via ORAL

## 2015-12-22 MED ORDER — HYDROCODONE-ACETAMINOPHEN 5-325 MG PO TABS: 1.0000 | ORAL_TABLET | Freq: Four times a day (QID) | ORAL | 0 refills | Status: DC | PRN

## 2015-12-22 MED ORDER — MIDAZOLAM HCL 5 MG/5ML IJ SOLN
INTRAMUSCULAR | Status: DC | PRN
  Administered 2015-12-22: 2 mg via INTRAVENOUS

## 2015-12-22 MED ORDER — KETOROLAC TROMETHAMINE 15 MG/ML IJ SOLN
INTRAMUSCULAR | Status: DC | PRN
  Administered 2015-12-22: 15 mg via INTRAVENOUS

## 2015-12-22 MED ORDER — OXYCODONE HCL 5 MG PO TABS
ORAL_TABLET | ORAL | Status: AC
  Administered 2015-12-22: 5 mg via ORAL
  Filled 2015-12-22: qty 1

## 2015-12-22 MED ORDER — HYDROMORPHONE HCL 1 MG/ML IJ SOLN
INTRAMUSCULAR | Status: AC
  Administered 2015-12-22: 0.5 mg via INTRAVENOUS
  Filled 2015-12-22: qty 1

## 2015-12-22 MED ORDER — CHLORHEXIDINE GLUCONATE CLOTH 2 % EX PADS: 6.0000 | MEDICATED_PAD | Freq: Once | CUTANEOUS | Status: DC

## 2015-12-22 MED ORDER — ACETAMINOPHEN 10 MG/ML IV SOLN
INTRAVENOUS | Status: AC
  Filled 2015-12-22: qty 100

## 2015-12-22 MED ORDER — ONDANSETRON HCL 4 MG/2ML IJ SOLN
INTRAMUSCULAR | Status: DC | PRN
  Administered 2015-12-22: 4 mg via INTRAVENOUS

## 2015-12-22 MED ORDER — MORPHINE SULFATE (PF) 2 MG/ML IV SOLN
1.0000 mg | INTRAVENOUS | Status: DC | PRN
  Administered 2015-12-22 (×2): 2 mg via INTRAVENOUS
  Administered 2015-12-22: 4 mg via INTRAVENOUS
  Administered 2015-12-23: 1 mg via INTRAVENOUS
  Filled 2015-12-22 (×2): qty 1
  Filled 2015-12-22: qty 2

## 2015-12-22 MED ORDER — MORPHINE SULFATE (PF) 2 MG/ML IV SOLN
INTRAVENOUS | Status: AC
  Filled 2015-12-22: qty 1

## 2015-12-22 MED ORDER — SODIUM CHLORIDE 0.9 % IV SOLN
INTRAVENOUS | Status: DC | PRN
  Administered 2015-12-22: 14 mL

## 2015-12-22 MED ORDER — HYDROCODONE-ACETAMINOPHEN 5-325 MG PO TABS
1.0000 | ORAL_TABLET | ORAL | Status: DC | PRN
  Administered 2015-12-23: 2 via ORAL
  Filled 2015-12-22: qty 2

## 2015-12-22 MED ORDER — SUGAMMADEX SODIUM 200 MG/2ML IV SOLN
INTRAVENOUS | Status: AC
  Filled 2015-12-22: qty 2

## 2015-12-22 MED ORDER — LIDOCAINE HCL (CARDIAC) 20 MG/ML IV SOLN
INTRAVENOUS | Status: DC | PRN
  Administered 2015-12-22: 60 mg via INTRAVENOUS
  Administered 2015-12-22: 40 mg via INTRATRACHEAL

## 2015-12-22 MED ORDER — ONDANSETRON HCL 4 MG/2ML IJ SOLN
INTRAMUSCULAR | Status: AC
  Filled 2015-12-22: qty 2

## 2015-12-22 MED ORDER — GLYCOPYRROLATE 0.2 MG/ML IJ SOLN
INTRAMUSCULAR | Status: DC | PRN
  Administered 2015-12-22: 0.2 mg via INTRAVENOUS

## 2015-12-22 MED ORDER — ONDANSETRON HCL 4 MG/2ML IJ SOLN
4.0000 mg | Freq: Four times a day (QID) | INTRAMUSCULAR | Status: AC | PRN
  Administered 2015-12-22: 4 mg via INTRAVENOUS

## 2015-12-22 MED ORDER — POTASSIUM CHLORIDE IN NACL 20-0.45 MEQ/L-% IV SOLN
INTRAVENOUS | Status: DC
  Administered 2015-12-22 – 2015-12-23 (×2): via INTRAVENOUS
  Filled 2015-12-22 (×3): qty 1000

## 2015-12-22 MED ORDER — ACETAMINOPHEN 10 MG/ML IV SOLN
INTRAVENOUS | Status: DC | PRN
  Administered 2015-12-22: 1000 mg via INTRAVENOUS

## 2015-12-22 MED ORDER — SUGAMMADEX SODIUM 200 MG/2ML IV SOLN
INTRAVENOUS | Status: DC | PRN
  Administered 2015-12-22: 200 mg via INTRAVENOUS

## 2015-12-22 MED ORDER — HYDROMORPHONE HCL 1 MG/ML IJ SOLN
0.2500 mg | INTRAMUSCULAR | Status: DC | PRN
  Administered 2015-12-22 (×4): 0.5 mg via INTRAVENOUS

## 2015-12-22 MED ORDER — ONDANSETRON 4 MG PO TBDP
4.0000 mg | ORAL_TABLET | Freq: Four times a day (QID) | ORAL | Status: DC | PRN
  Administered 2015-12-23: 4 mg via ORAL
  Filled 2015-12-22: qty 1

## 2015-12-22 MED ORDER — HEPARIN SODIUM (PORCINE) 5000 UNIT/ML IJ SOLN
5000.0000 [IU] | Freq: Three times a day (TID) | INTRAMUSCULAR | Status: DC
  Administered 2015-12-22: 5000 [IU] via SUBCUTANEOUS
  Filled 2015-12-22: qty 1

## 2015-12-22 MED ORDER — ONDANSETRON HCL 4 MG/2ML IJ SOLN
4.0000 mg | Freq: Four times a day (QID) | INTRAMUSCULAR | Status: DC | PRN
  Administered 2015-12-22: 4 mg via INTRAVENOUS
  Filled 2015-12-22: qty 2

## 2015-12-22 MED ORDER — ROCURONIUM 10MG/ML (10ML) SYRINGE FOR MEDFUSION PUMP - OPTIME
INTRAVENOUS | Status: DC | PRN
  Administered 2015-12-22: 50 mg via INTRAVENOUS

## 2015-12-22 MED ORDER — DEXAMETHASONE SODIUM PHOSPHATE 10 MG/ML IJ SOLN
INTRAMUSCULAR | Status: AC
  Filled 2015-12-22: qty 1

## 2015-12-22 MED ORDER — LACTATED RINGERS IV SOLN
INTRAVENOUS | Status: DC
  Administered 2015-12-22 (×3): via INTRAVENOUS

## 2015-12-22 MED ORDER — BUPIVACAINE HCL (PF) 0.25 % IJ SOLN
INTRAMUSCULAR | Status: AC
  Filled 2015-12-22: qty 30

## 2015-12-22 MED ORDER — LIDOCAINE 2% (20 MG/ML) 5 ML SYRINGE
INTRAMUSCULAR | Status: AC
Start: 2015-12-22 — End: 2015-12-22
  Filled 2015-12-22: qty 5

## 2015-12-22 MED ORDER — FENTANYL CITRATE (PF) 100 MCG/2ML IJ SOLN
INTRAMUSCULAR | Status: AC
  Filled 2015-12-22: qty 2

## 2015-12-22 MED ORDER — DEXAMETHASONE SODIUM PHOSPHATE 4 MG/ML IJ SOLN
INTRAMUSCULAR | Status: DC | PRN
  Administered 2015-12-22: 10 mg via INTRAVENOUS

## 2015-12-22 MED ORDER — FENTANYL CITRATE (PF) 100 MCG/2ML IJ SOLN
INTRAMUSCULAR | Status: AC
  Filled 2015-12-22: qty 4

## 2015-12-22 MED ORDER — FENTANYL CITRATE (PF) 100 MCG/2ML IJ SOLN
INTRAMUSCULAR | Status: DC | PRN
  Administered 2015-12-22: 50 ug via INTRAVENOUS
  Administered 2015-12-22: 150 ug via INTRAVENOUS
  Administered 2015-12-22 (×2): 50 ug via INTRAVENOUS

## 2015-12-22 MED ORDER — PROPOFOL 10 MG/ML IV BOLUS
INTRAVENOUS | Status: DC | PRN
Start: 2015-12-22 — End: 2015-12-22
  Administered 2015-12-22: 200 mg via INTRAVENOUS

## 2015-12-22 MED ORDER — IBUPROFEN 600 MG PO TABS: 600.0000 mg | ORAL_TABLET | Freq: Four times a day (QID) | ORAL | Status: DC | PRN

## 2015-12-22 MED ORDER — SODIUM CHLORIDE 0.9 % IR SOLN
Status: DC | PRN
  Administered 2015-12-22: 1000 mL

## 2015-12-22 MED ORDER — IOPAMIDOL (ISOVUE-300) INJECTION 61%
INTRAVENOUS | Status: AC
  Filled 2015-12-22: qty 50

## 2015-12-22 MED ORDER — BUPIVACAINE HCL 0.25 % IJ SOLN
INTRAMUSCULAR | Status: DC | PRN
  Administered 2015-12-22: 30 mL

## 2015-12-22 MED ORDER — 0.9 % SODIUM CHLORIDE (POUR BTL) OPTIME
TOPICAL | Status: DC | PRN
  Administered 2015-12-22: 1000 mL

## 2015-12-22 MED ORDER — ROCURONIUM BROMIDE 10 MG/ML (PF) SYRINGE
PREFILLED_SYRINGE | INTRAVENOUS | Status: AC
  Filled 2015-12-22: qty 10

## 2015-12-22 MED ORDER — SUCCINYLCHOLINE CHLORIDE 200 MG/10ML IV SOSY
PREFILLED_SYRINGE | INTRAVENOUS | Status: AC
  Filled 2015-12-22: qty 10

## 2015-12-22 MED ORDER — OXYCODONE HCL 5 MG/5ML PO SOLN: 5.0000 mg | Freq: Once | ORAL | Status: AC | PRN

## 2015-12-22 SURGICAL SUPPLY — 50 items
APPLIER CLIP 5 13 M/L LIGAMAX5 (MISCELLANEOUS)
APPLIER CLIP ROT 10 11.4 M/L (STAPLE) ×2
APR CLP MED LRG 11.4X10 (STAPLE) ×1
APR CLP MED LRG 5 ANG JAW (MISCELLANEOUS)
BAG SPEC RTRVL LRG 6X4 10 (ENDOMECHANICALS) ×1
BLADE SURG ROTATE 9660 (MISCELLANEOUS) IMPLANT
CANISTER SUCTION 2500CC (MISCELLANEOUS) ×2 IMPLANT
CHLORAPREP W/TINT 26ML (MISCELLANEOUS) ×2 IMPLANT
CHOLANGIOGRAM CATH TAUT (CATHETERS) ×2 IMPLANT
CLIP APPLIE 5 13 M/L LIGAMAX5 (MISCELLANEOUS) IMPLANT
CLIP APPLIE ROT 10 11.4 M/L (STAPLE) IMPLANT
COVER MAYO STAND STRL (DRAPES) ×2 IMPLANT
COVER SURGICAL LIGHT HANDLE (MISCELLANEOUS) ×2 IMPLANT
DRAPE C-ARM 42X72 X-RAY (DRAPES) ×2 IMPLANT
ELECT REM PT RETURN 9FT ADLT (ELECTROSURGICAL) ×2
ELECTRODE REM PT RTRN 9FT ADLT (ELECTROSURGICAL) ×1 IMPLANT
FILTER SMOKE EVAC LAPAROSHD (FILTER) ×2 IMPLANT
GLOVE BIO SURGEON STRL SZ7 (GLOVE) ×1 IMPLANT
GLOVE BIO SURGEON STRL SZ7.5 (GLOVE) ×1 IMPLANT
GLOVE BIOGEL PI IND STRL 7.5 (GLOVE) IMPLANT
GLOVE BIOGEL PI IND STRL 8 (GLOVE) IMPLANT
GLOVE BIOGEL PI INDICATOR 7.5 (GLOVE) ×1
GLOVE BIOGEL PI INDICATOR 8 (GLOVE) ×1
GLOVE ECLIPSE 8.0 STRL XLNG CF (GLOVE) ×1 IMPLANT
GLOVE SURG SIGNA 7.5 PF LTX (GLOVE) ×2 IMPLANT
GOWN STRL REUS W/ TWL LRG LVL3 (GOWN DISPOSABLE) ×2 IMPLANT
GOWN STRL REUS W/ TWL XL LVL3 (GOWN DISPOSABLE) ×1 IMPLANT
GOWN STRL REUS W/TWL LRG LVL3 (GOWN DISPOSABLE) ×4
GOWN STRL REUS W/TWL XL LVL3 (GOWN DISPOSABLE) ×2
IV CATH 14GX2 1/4 (CATHETERS) ×2 IMPLANT
KIT BASIN OR (CUSTOM PROCEDURE TRAY) ×2 IMPLANT
KIT ROOM TURNOVER OR (KITS) ×2 IMPLANT
LIQUID BAND (GAUZE/BANDAGES/DRESSINGS) ×2 IMPLANT
NS IRRIG 1000ML POUR BTL (IV SOLUTION) ×2 IMPLANT
PAD ARMBOARD 7.5X6 YLW CONV (MISCELLANEOUS) ×2 IMPLANT
POUCH SPECIMEN RETRIEVAL 10MM (ENDOMECHANICALS) ×2 IMPLANT
SCISSORS LAP 5X35 DISP (ENDOMECHANICALS) ×2 IMPLANT
SET IRRIG TUBING LAPAROSCOPIC (IRRIGATION / IRRIGATOR) ×2 IMPLANT
SLEEVE ENDOPATH XCEL 5M (ENDOMECHANICALS) ×2 IMPLANT
SPECIMEN JAR SMALL (MISCELLANEOUS) ×2 IMPLANT
STOPCOCK 4 WAY LG BORE MALE ST (IV SETS) ×2 IMPLANT
SUT MON AB 5-0 PS2 18 (SUTURE) ×2 IMPLANT
TOWEL OR 17X24 6PK STRL BLUE (TOWEL DISPOSABLE) ×2 IMPLANT
TOWEL OR 17X26 10 PK STRL BLUE (TOWEL DISPOSABLE) ×2 IMPLANT
TRAY LAPAROSCOPIC MC (CUSTOM PROCEDURE TRAY) ×2 IMPLANT
TROCAR XCEL BLUNT TIP 100MML (ENDOMECHANICALS) ×2 IMPLANT
TROCAR XCEL NON-BLD 11X100MML (ENDOMECHANICALS) ×1 IMPLANT
TROCAR XCEL NON-BLD 5MMX100MML (ENDOMECHANICALS) ×2 IMPLANT
TUBING EXTENTION W/L.L. (IV SETS) ×2 IMPLANT
TUBING INSUFFLATION (TUBING) ×2 IMPLANT

## 2015-12-22 NOTE — Interval H&P Note (Signed)
History and Physical Interval Note:  12/22/2015 12:55 PM  Valerie Green  has presented today for surgery, with the diagnosis of Chronic cholecystitis, cholelithiasis  The various methods of treatment have been discussed with the patient and family.  Mother and grandmother in room.  She'll plan to go home today.  After consideration of risks, benefits and other options for treatment, the patient has consented to  Procedure(s): LAPAROSCOPIC CHOLECYSTECTOMY WITH INTRAOPERATIVE CHOLANGIOGRAM (N/A) as a surgical intervention .  The patient's history has been reviewed, patient examined, no change in status, stable for surgery.  I have reviewed the patient's chart and labs.  Questions were answered to the patient's satisfaction.     Joby Hershkowitz H

## 2015-12-22 NOTE — Anesthesia Procedure Notes (Signed)
Procedure Name: Intubation Date/Time: 12/22/2015 1:44 PM Performed by: Doyce LooseHOFFMAN, Raelynne Ludwick ANN Pre-anesthesia Checklist: Patient identified, Emergency Drugs available, Suction available and Patient being monitored Patient Re-evaluated:Patient Re-evaluated prior to inductionOxygen Delivery Method: Circle System Utilized Preoxygenation: Pre-oxygenation with 100% oxygen Intubation Type: IV induction Ventilation: Mask ventilation without difficulty Laryngoscope Size: Miller and 2 Grade View: Grade I Tube type: Oral Tube size: 7.0 mm Number of attempts: 1 Airway Equipment and Method: Stylet and Oral airway Placement Confirmation: ETT inserted through vocal cords under direct vision,  positive ETCO2 and breath sounds checked- equal and bilateral Secured at: 22 cm Tube secured with: Tape Dental Injury: Teeth and Oropharynx as per pre-operative assessment

## 2015-12-22 NOTE — Transfer of Care (Signed)
Immediate Anesthesia Transfer of Care Note  Patient: Valerie Green  Procedure(s) Performed: Procedure(s): LAPAROSCOPIC CHOLECYSTECTOMY WITH INTRAOPERATIVE CHOLANGIOGRAM (N/A)  Patient Location: PACU  Anesthesia Type:General  Level of Consciousness: awake, alert , oriented and patient cooperative  Airway & Oxygen Therapy: Patient Spontanous Breathing and Patient connected to nasal cannula oxygen  Post-op Assessment: Report given to RN, Post -op Vital signs reviewed and stable, Patient moving all extremities and Patient moving all extremities X 4  Post vital signs: Reviewed and stable  Last Vitals:  Vitals:   12/22/15 1150  BP: 115/65  Pulse: 63  Resp: 18  Temp: 36.7 C    Last Pain:  Vitals:   12/22/15 1150  TempSrc: Oral         Complications: No apparent anesthesia complications

## 2015-12-22 NOTE — Op Note (Signed)
12/22/2015  2:41 PM  PATIENT:  Valerie Green, 30 y.o., female, MRN: [id number]  PREOP DIAGNOSIS:  Chronic cholecystitis, cholelithiasis  POSTOP DIAGNOSIS:   Chronic cholecystitis, cholelithiasis  PROCEDURE:   Procedure(s):  LAPAROSCOPIC CHOLECYSTECTOMY WITH INTRAOPERATIVE CHOLANGIOGRAM  SURGEON:   Ovidio Kinavid Paiden Caraveo, M.D.  ASSISTANGordy Savers:   S. Gross, M.D.  ANESTHESIA:   general  Anesthesiologist: Achille RichAdam Hodierne, MD CRNA: Mickeal SkinnerHolli Ann Hoffman, CRNA; Coralee Rudobert Flores, CRNA  General  ASA: @asa @  EBL:  minimal  ml  BLOOD ADMINISTERED: none  DRAINS: none   LOCAL MEDICATIONS USED:   30 cc 1/4% marcaine  SPECIMEN:   Gall bladder  COUNTS CORRECT:  YES  INDICATIONS FOR PROCEDURE:  Valerie Green is a 30 y.o. (DOB: 07/14/1985) AA female whose primary care physician is No PCP Per Patient and comes for cholecystectomy.   The indications and risks of the gall bladder surgery were explained to the patient.  The risks include, but are not limited to, infection, bleeding, common bile duct injury and open surgery.  SURGERY:  The patient was [facility].  The abdomen was prepped with chloroprep.  The patient was given 3 gm Ancef at the beginning of the operation.   A time out was held and the surgical checklist run.   An infraumbilical incision was made into the abdominal cavity.  A 12 mm Hasson trocar was inserted into the abdominal cavity through the infraumbilical incision and secured with a 0 Vicryl suture.  Three additional trocars were inserted: a 10 mm trocar in the sub-xiphoid location, a 5 mm trocar in the right mid subcostal area, and a 5 mm trocar in the right lateral subcostal area.   The abdomen was explored and the liver, stomach, and bowel that could be seen were unremarkable.   The gall bladder was whitish and had some attachments to the duodenum, consistent with chronic cholecystitis.   I grasped the gall bladder and rotated it cephalad.  Disssection was  carried down to the gall bladder/cystic duct junction and the cystic duct isolated.  A clip was placed on the gall bladder side of the cystic duct.   An intra-operative cholangiogram was shot.   The intra-operative cholangiogram was shot using a cut off Taut catheter placed through a 14 gauge angiocath in the RUQ.  The Taut catheter was inserted in the cut cystic duct and secured with an endoclip.  A cholangiogram was shot with 8 cc of 1/2 strength Omnipaque.  Using fluoroscopy, the cholangiogram showed the flow of contrast into the common bile duct, up the hepatic radicals, and into the duodenum.  There was no mass or obstruction.  This was a normal intra-operative cholangiogram.   The Taut catheter was removed.  The cystic duct was tripley endoclipped and the cystic artery was identified and clipped.  The gall bladder was bluntly and sharpley dissected from the gall bladder bed.   After the gall bladder was removed from the liver, the gall bladder bed and [place] were inspected.  There was no bleeding or bile leak.  The gall bladder was placed in a endocatch bag and delivered through the umbilicus.  The abdomen was irrigated with 800 cc saline.   The trocars were then removed.  I infiltrated 30 cc of 1/4% Marcaine into the incisions.  The umbilical port closed with a 0 Vicryl suture and the skin closed with 5-0 Monocryl.  The skin was painted with LiquidBand.  The patient's sponge and  needle count were correct.  The patient was transported to the RR in good condition.  Alphonsa Overall, MD, [facility] Pager: [phone] Office phone:  [phone]

## 2015-12-22 NOTE — Discharge Instructions (Signed)
[facility] - DISCHARGE INSTRUCTIONS TO PATIENT  Activity:  Driving - May drive in 2 to 4 days, if doing well and off pain meds   Lifting - No lifting more than 15 pounds for 1 week, then no limit  Wound Care:   Leave incisions dry for 2 days.  Then may shower.         No public water - lake, pool, etc.  Diet:  Eat light for a few days.  No limit.  Follow up appointment:  Call Dr. Allene PyoNewman's office [facility]) at [phone] for an appointment in 2 to 3 weeks.  Medications and dosages:  Resume your home medications.  You have a prescription for:  Vicodin  Call Dr. Ezzard StandingNewman or his office  [phone]) if you have:  Temperature greater than 100.4,  Persistent nausea and vomiting,  Severe uncontrolled pain,  Redness, tenderness, or signs of infection (pain, swelling, redness, odor or green/yellow discharge around the site),  Any other questions or concerns you may have after discharge.  In an emergency, call 911 or go to an Emergency Department at a nearby hospital.

## 2015-12-22 NOTE — Anesthesia Preprocedure Evaluation (Signed)
Anesthesia Evaluation  Patient identified by MRN, date of birth, ID band Patient awake    Reviewed: Allergy & Precautions, NPO status , Patient's Chart, lab work & pertinent test results  Airway Mallampati: II   Neck ROM: full    Dental   Pulmonary    breath sounds clear to auscultation       Cardiovascular  Rhythm:regular Rate:Normal     Neuro/Psych  Headaches, Anxiety ADD   GI/Hepatic GERD  ,  Endo/Other  Morbid obesity  Renal/GU      Musculoskeletal   Abdominal   Peds  Hematology   Anesthesia Other Findings   Reproductive/Obstetrics                             Anesthesia Physical Anesthesia Plan  ASA: II  Anesthesia Plan: General   Post-op Pain Management:    Induction: Intravenous  Airway Management Planned: Oral ETT  Additional Equipment:   Intra-op Plan:   Post-operative Plan: Extubation in OR  Informed Consent: I have reviewed the patients History and Physical, chart, labs and discussed the procedure including the risks, benefits and alternatives for the proposed anesthesia with the patient or authorized representative who has indicated his/her understanding and acceptance.     Plan Discussed with: CRNA, Anesthesiologist and Surgeon  Anesthesia Plan Comments:         Anesthesia Quick Evaluation

## 2015-12-23 DIAGNOSIS — K8010 Calculus of gallbladder with chronic cholecystitis without obstruction: Secondary | ICD-10-CM | POA: Diagnosis not present

## 2015-12-23 DIAGNOSIS — Z9049 Acquired absence of other specified parts of digestive tract: Secondary | ICD-10-CM

## 2015-12-23 MED ORDER — HYDROCODONE-ACETAMINOPHEN 5-325 MG PO TABS: 1.0000 | ORAL_TABLET | ORAL | 0 refills | Status: DC | PRN

## 2015-12-23 MED ORDER — PROMETHAZINE HCL 25 MG/ML IJ SOLN
12.5000 mg | INTRAMUSCULAR | Status: DC | PRN
  Administered 2015-12-23: 12.5 mg via INTRAVENOUS
  Filled 2015-12-23: qty 1

## 2015-12-23 NOTE — Discharge Planning (Signed)
AVS and rx given to patient who verbalizes understanding. DC ambulatory to private car home with all personal belongings, accompanied by Mom at 1445.

## 2015-12-23 NOTE — Anesthesia Postprocedure Evaluation (Signed)
Anesthesia Post Note  Patient: Valerie Green  Procedure(s) Performed: Procedure(s) (LRB): LAPAROSCOPIC CHOLECYSTECTOMY WITH INTRAOPERATIVE CHOLANGIOGRAM (N/A)  Patient location during evaluation: PACU Anesthesia Type: General Level of consciousness: awake and alert and patient cooperative Pain management: pain level controlled Vital Signs Assessment: post-procedure vital signs reviewed and stable Respiratory status: spontaneous breathing and respiratory function stable Cardiovascular status: stable Anesthetic complications: no    Last Vitals:  Vitals:   12/22/15 2221 12/23/15 0229  BP: 114/63 114/61  Pulse: (!) 48 (!) 50  Resp:  14  Temp: 37.1 C 36.8 C    Last Pain:  Vitals:   12/23/15 0237  TempSrc:   PainSc: 5                  Kamdyn Covel S

## 2015-12-25 ENCOUNTER — Encounter (HOSPITAL_COMMUNITY): Payer: Self-pay | Admitting: Surgery

## 2015-12-31 NOTE — Discharge Summary (Signed)
Physician Discharge Summary  Patient ID:  Valerie Green  MRN: [id number]  DOB/AGE: 28/14/1987 28 y.o.  Admit date: 12/22/2015 Discharge date: 12/31/2015  Discharge Diagnoses:  1.  GALL BLADDER DISEASE (K82.9)                       2.  HYPERGLYCEMIA (R73.9)          Recommend getting a primary care physician to follow elevated blood sugars 3.  MORBID OBESITY WITH BMI OF 50.0-59.9, ADULT (E66.01)  Principal Problem:   S/P laparoscopic cholecystectomy August 2017 Active Problems:   Cholecystitis  Operation: Procedure(s): LAPAROSCOPIC CHOLECYSTECTOMY WITH INTRAOPERATIVE CHOLANGIOGRAM on 12/22/2015 - D. Ezzard Standing  Discharged Condition: good  Hospital Course: Valerie Green is an 30 y.o. female whose primary care physician is No PCP Per Patient and who was admitted 12/22/2015 with a chief complaint of gall bladder disease.   She was brought to the operating room on 12/22/2015 and underwent LAPAROSCOPIC CHOLECYSTECTOMY WITH INTRAOPERATIVE CHOLANGIOGRAM.   At first she was going to try to go home the same day, but she had enough pain and nausea to warrant spending the night. She is now one day post op, doing better, and ready to go home.  The discharge instructions were reviewed with the patient.  Consults: None  Significant Diagnostic Studies: Results for orders placed or performed during the hospital encounter of 12/22/15  CBC WITH DIFFERENTIAL  Result Value Ref Range   WBC 6.3 4.0 - 10.5 K/uL   RBC 4.35 3.87 - 5.11 MIL/uL   Hemoglobin 12.5 12.0 - 15.0 g/dL   HCT 40.9 81.1 - 91.4 %   MCV 85.5 78.0 - 100.0 fL   MCH 28.7 26.0 - 34.0 pg   MCHC 33.6 30.0 - 36.0 g/dL   RDW 78.2 95.6 - 21.3 %   Platelets 250 150 - 400 K/uL   Neutrophils Relative % 43 %   Neutro Abs 2.7 1.7 - 7.7 K/uL   Lymphocytes Relative 51 %   Lymphs Abs 3.2 0.7 - 4.0 K/uL   Monocytes Relative 5 %   Monocytes Absolute 0.3 0.1 - 1.0 K/uL   Eosinophils Relative 1 %   Eosinophils Absolute 0.1 0.0 - 0.7 K/uL    Basophils Relative 0 %   Basophils Absolute 0.0 0.0 - 0.1 K/uL  Comprehensive metabolic panel  Result Value Ref Range   Sodium 137 135 - 145 mmol/L   Potassium 3.9 3.5 - 5.1 mmol/L   Chloride 106 101 - 111 mmol/L   CO2 25 22 - 32 mmol/L   Glucose, Bld 104 (H) 65 - 99 mg/dL   BUN 8 6 - 20 mg/dL   Creatinine, Ser 0.86 0.44 - 1.00 mg/dL   Calcium 9.0 8.9 - 57.8 mg/dL   Total Protein 7.3 6.5 - 8.1 g/dL   Albumin 3.7 3.5 - 5.0 g/dL   AST 26 15 - 41 U/L   ALT 17 14 - 54 U/L   Alkaline Phosphatase 42 38 - 126 U/L   Total Bilirubin 0.3 0.3 - 1.2 mg/dL   GFR calc non Af Amer >60 >60 mL/min   GFR calc Af Amer >60 >60 mL/min   Anion gap 6 5 - 15  Hemoglobin A1c  Result Value Ref Range   Hgb A1c MFr Bld 5.9 (H) 4.8 - 5.6 %   Mean Plasma Glucose 123 mg/dL  hCG, serum, qualitative  Result Value Ref Range   Preg, Serum NEGATIVE NEGATIVE  I-STAT 4, (  NA,K, GLUC, HGB,HCT)  Result Value Ref Range   Sodium 146 (H) 135 - 145 mmol/L   Potassium 4.7 3.5 - 5.1 mmol/L   Glucose, Bld 130 (H) 65 - 99 mg/dL   HCT 95.6 21.3 - 08.6 %   Hemoglobin 12.6 12.0 - 15.0 g/dL    Dg Cholangiogram Operative  Result Date: 12/22/2015 CLINICAL DATA:  30 year old female with a history of cholelithiasis EXAM: INTRAOPERATIVE CHOLANGIOGRAM TECHNIQUE: Cholangiographic images from the C-arm fluoroscopic device were submitted for interpretation post-operatively. Please see the procedural report for the amount of contrast and the fluoroscopy time utilized. COMPARISON:  None. FINDINGS: Surgical instruments project over the upper abdomen. There is cannulation of the cystic duct/gallbladder neck, with antegrade infusion of contrast. Caliber of the extrahepatic ductal system within normal limits. No large filling defect identified. Free flow of contrast across the ampulla. IMPRESSION: Intraoperative cholangiogram demonstrates extrahepatic biliary ducts of unremarkable caliber, with no large filling defect identified. Free flow  of contrast across the ampulla. Please refer to the dictated operative report for full details of intraoperative findings and procedure Signed, Yvone Neu. Loreta Ave, DO [facility] Radiology Electronically Signed   By: Gilmer Mor D.O.   On: 12/22/2015 14:38    Discharge Exam:  Vitals:   12/23/15 0229 12/23/15 0942  BP: 114/61 105/62  Pulse: (!) 50 (!) 50  Resp: 14 16  Temp: 98.2 F (36.8 C) 98.8 F (37.1 C)    General: Obese AA F who is alert and generally healthy appearing.  Lungs: Clear to auscultation and symmetric breath sounds. Heart:  RRR. No murmur or rub. Abdomen: Soft. Incisions look okay.   Discharge Medications:     Medication List    TAKE these medications   acetaminophen 500 MG tablet Commonly known as:  TYLENOL Take 500-1,000 mg by mouth every 6 (six) hours as needed for mild pain or headache.   Ciclopirox 1 % shampoo Apply 1 application topically once a week.   FLUOCINOLONE ACETONIDE BODY 0.01 % Oil Apply 1 application topically once a week.   HYDROcodone-acetaminophen 5-325 MG tablet Commonly known as:  NORCO/VICODIN Take 1-2 tablets by mouth every 6 (six) hours as needed. What changed:  reasons to take this   HYDROcodone-acetaminophen 5-325 MG tablet Commonly known as:  NORCO/VICODIN Take 1-2 tablets by mouth every 6 (six) hours as needed. What changed:  You were already taking a medication with the same name, and this prescription was added. Make sure you understand how and when to take each.   HYDROcodone-acetaminophen 5-325 MG tablet Commonly known as:  NORCO/VICODIN Take 1-2 tablets by mouth every 4 (four) hours as needed for moderate pain. What changed:  You were already taking a medication with the same name, and this prescription was added. Make sure you understand how and when to take each.   omeprazole 20 MG capsule Commonly known as:  PRILOSEC Take 1 capsule (20 mg total) by mouth 2 (two)  times daily before a meal. What changed:  when to take this  reasons to take this   ondansetron 4 MG tablet Commonly known as:  ZOFRAN Take 4 mg by mouth every 8 (eight) hours as needed for nausea or vomiting.       Disposition: 01-Home or Self Care  Discharge Instructions    Diet - low sodium heart healthy    Complete by:  As directed   Diet - low sodium heart healthy    Complete by:  As directed  Increase activity slowly    Complete by:  As directed   Increase activity slowly    Complete by:  As directed       Signed: Ovidio Kinavid Livio Ledwith, M.D., [facility] Office:  [phone]  12/31/2015, 3:50 PM

## 2016-01-19 ENCOUNTER — Ambulatory Visit (INDEPENDENT_AMBULATORY_CARE_PROVIDER_SITE_OTHER): Payer: 59 | Admitting: Family Medicine

## 2016-01-19 ENCOUNTER — Encounter: Payer: Self-pay | Admitting: Family Medicine

## 2016-01-19 VITALS — BP 128/76 | HR 93 | Resp 12 | Ht 64.0 in | Wt 313.4 lb

## 2016-01-19 DIAGNOSIS — G4730 Sleep apnea, unspecified: Secondary | ICD-10-CM | POA: Diagnosis not present

## 2016-01-19 DIAGNOSIS — Z6841 Body Mass Index (BMI) 40.0 and over, adult: Secondary | ICD-10-CM | POA: Diagnosis not present

## 2016-01-19 DIAGNOSIS — G4700 Insomnia, unspecified: Secondary | ICD-10-CM | POA: Insufficient documentation

## 2016-01-19 DIAGNOSIS — R739 Hyperglycemia, unspecified: Secondary | ICD-10-CM | POA: Diagnosis not present

## 2016-01-19 MED ORDER — MELATONIN ER 5 MG PO TBCR: 5.0000 mg | EXTENDED_RELEASE_TABLET | Freq: Every day | ORAL | 3 refills | Status: DC

## 2016-01-19 NOTE — Progress Notes (Signed)
HPI:   Valerie Green is a 30 y.o. female, who is here today to establish care with me.  Former PCP: Theatre stage manager, [place] Last preventive routine visit: 3 years ago.  Concerns today: weight and BS.  Recently she underwent cholecystectomy, 12/22/15,she still having some abdominal discomfort and nausea; both progressively improving. She already followed with Dr Ezzard Standing, surgeon, 01/12/16.  She reported history of depression, she denies any current symptom. She never tried any pharmacologic treatment and denies hospitalizations due to psychiatric disorders. Anxiety disorder as a teenager. Suicidal thoughts in 2008, improved after moving out to her own placed. She is back to her parents hose, living with them but has a better relation with them, helping her with financial issues.    She is not exercising and she doesn't follow a healthy diet, she usually eats fast food.  According to patient, bariatric surgery was mentioned at some point, so she can lose weight "faster."  She is also concerned about elevated glucose noted when she had lab work done during ER visits and preop evaluation. She denies any history of diabetes, parents with Hx of  DM 2. Last glucose was fasting at 130.     Lab Results  Component Value Date   CREATININE 0.91 12/19/2015   BUN 8 12/19/2015   NA 146 (H) 12/22/2015   K 4.7 12/22/2015   CL 106 12/19/2015   CO2 25 12/19/2015   Lab Results  Component Value Date   HGBA1C 5.9 (H) 12/19/2015   -She also has history of insomnia, he has been worse since she had cholecystectomy. She has not tried OTC medications.  When inquiring about sleep apnea, she mentions that a sleep study was ordered about a year ago but she missed appointment due to work schedule. She states that sometimes she wakes up herself due to loud snoring. Occasionally she has fatigue when she first gets up in the morning but better when she sleeps well.    Review of Systems    Constitutional: Positive for fatigue. Negative for activity change, appetite change, fever and unexpected weight change.  HENT: Negative for mouth sores, nosebleeds and trouble swallowing.   Eyes: Negative for redness and visual disturbance.  Respiratory: Positive for choking (waking up at night). Negative for cough, shortness of breath and wheezing.   Cardiovascular: Negative for chest pain, palpitations and leg swelling.  Gastrointestinal: Positive for abdominal pain and nausea. Negative for vomiting.       Negative for changes in bowel habits.  Endocrine: Negative for cold intolerance, heat intolerance, polydipsia, polyphagia and polyuria.  Genitourinary: Negative for decreased urine volume, difficulty urinating and hematuria.  Musculoskeletal: Negative for back pain and myalgias.  Skin: Negative for color change and rash.  Neurological: Negative for seizures, syncope, weakness, numbness and headaches.  Psychiatric/Behavioral: Positive for sleep disturbance. Negative for behavioral problems, confusion, hallucinations and suicidal ideas. The patient is not nervous/anxious.       Current Outpatient Prescriptions on File Prior to Visit  Medication Sig Dispense Refill  . acetaminophen (TYLENOL) 500 MG tablet Take 500-1,000 mg by mouth every 6 (six) hours as needed for mild pain or headache.     . Ciclopirox 1 % shampoo Apply 1 application topically once a week.    Marland Kitchen FLUOCINOLONE ACETONIDE BODY 0.01 % OIL Apply 1 application topically once a week.  1  . omeprazole (PRILOSEC) 20 MG capsule Take 1 capsule (20 mg total) by mouth 2 (two) times daily before a  meal. (Patient taking differently: Take 20 mg by mouth 2 (two) times daily as needed (reflux). ) 30 capsule 0  . ondansetron (ZOFRAN) 4 MG tablet Take 4 mg by mouth every 8 (eight) hours as needed for nausea or vomiting.     No current facility-administered medications on file prior to visit.      Past Medical History:  Diagnosis Date   . ADD (attention deficit disorder)   . Allergy   . Anemia   . Anxiety state 09/07/2015  . Dysrhythmia   . Gastritis   . GERD (gastroesophageal reflux disease)   . Headache   . Pneumonia   . Pre-diabetes    Allergies  Allergen Reactions  . Coffee Bean Extract [Coffea Arabica] Anaphylaxis  . Other Other (See Comments)    Chocolate, Garlic, spices, citrus cause gastritis  . Milk-Related Compounds Other (See Comments)    unknown    Family History  Problem Relation Age of Onset  . Diabetes Mother   . Diabetes Father   . Hyperlipidemia Brother   . Heart disease Maternal Aunt   . Diabetes Paternal Uncle     Social History   Social History  . Marital status: Single    Spouse name: N/A  . Number of children: N/A  . Years of education: N/A   Social History Main Topics  . Smoking status: Never Smoker  . Smokeless tobacco: Never Used  . Alcohol use No  . Drug use: No  . Sexual activity: Not Asked   Other Topics Concern  . None   Social History Narrative  . None    Vitals:   01/19/16 1302  BP: 128/76  Pulse: 93  Resp: 12   O2 sat 98% at RA.  Body mass index is 53.79 kg/m.     Physical Exam  Nursing note and vitals reviewed. Constitutional: She is oriented to person, place, and time. She appears well-developed. No distress.  HENT:  Head: Atraumatic.  Mouth/Throat: Oropharynx is clear and moist and mucous membranes are normal.  Eyes: Conjunctivae and EOM are normal. Pupils are equal, round, and reactive to light.  Neck: No thyroid mass and no thyromegaly present.  Cardiovascular: Normal rate and regular rhythm.   No murmur heard. Pulses:      Dorsalis pedis pulses are 2+ on the right side, and 2+ on the left side.  Respiratory: Effort normal and breath sounds normal. No respiratory distress.  GI: Soft. She exhibits no mass. There is no hepatomegaly. There is no tenderness.  Musculoskeletal: She exhibits edema (trace pitting edema LE bilateral.).   Lymphadenopathy:    She has no cervical adenopathy.  Neurological: She is alert and oriented to person, place, and time. She has normal strength. Coordination and gait normal.  Skin: Skin is warm. No erythema.  Psychiatric: She has a normal mood and affect. Her speech is normal.  Well groomed, good eye contact.      ASSESSMENT AND PLAN:     Barrie LymeRashada was seen today for new patient (initial visit).  Diagnoses and all orders for this visit:  Hyperglycemia  We discussed criteria for diagnosis for DM II. Recommend healthy lifestyle for primary prevention. She will have fasting labs next week and further recommendations would be given accordingly.  -     Fructosamine; Future -     TSH; Future  -     Basic Metabolic Panel; Future  Sleep apnea  Weight loss might help. Referral to pulmonology to discuss possibility of a  sleep study was placed.  -     Ambulatory referral to Pulmonology  Insomnia, unspecified  Good sleep hygiene. Recommended OTC Melatonin. She denies any symptom of depression or anxiety, if insomnia is not better I may consider Trazodone. Follow-up in 3 months.   -     Melatonin ER 5 MG TBCR; Take 5 mg by mouth at bedtime.   BMI 50.0-59.9, adult (HCC)  We discussed benefits of wt loss as well as adverse effects of obesity. We discussed treatment options. Consistency with healthy diet and physical activity recommended. Weight Watchers is a good option as well as daily brisk walking for 15-30 min as tolerated. F/U in 3 months.           Courtez Twaddle G. [place], MD  [facility]. [place] office.

## 2016-01-19 NOTE — Patient Instructions (Signed)
A few things to remember from today's visit:   Hyperglycemia - Plan: Fructosamine, TSH, Glucose, fasting  Sleep apnea - Plan: Ambulatory referral to Pulmonology  Insomnia, unspecified - Plan: Melatonin ER 5 MG TBCR What are some tips for weight loss? People become overweight for many reasons. Weight issues can run in families. They can be caused by unhealthy behaviors and a person's environment. Certain health problems and medicines can also lead to weight gain. There are some simple things you can do to reach and maintain a healthy weight:  Eat small more frequent healthy meals instead 3 bid meals. Also Weight Watchers is a good option. Avoid sweet drinks. These include regular soft drinks, fruit juices, fruit drinks, energy drinks, sweetened iced tea, and flavored milk. Avoid fast foods. Fast foods such as french fries, hamburgers, chicken nuggets, and pizza are high in calories and can cause weight gain. Eat a healthy breakfast. People who skip breakfast tend to weigh more. Don't watch more than two hours of television per day. Chew sugar-free gum between meals to cut down on snacking. Avoid grocery shopping when you're hungry. Pack a healthy lunch instead of eating out to control what and how much you eat. Eat a lot of fruits and vegetables. Aim for about 2 cups of fruit and 2 to 3 cups of vegetables per day. Aim for 150 minutes per week of moderate-intensity exercise (such as brisk walking), or 75 minutes per week of vigorous exercise (such as jogging or running). OR 15-30 min of daily brisk walking. Be more active. Small changes in physical activity can easily be added to your daily routine. For example, take the stairs instead of the elevator. Take a walk with your family. A daily walk is a great way to get exercise and to catch up on the day's events.    Please be sure medication list is accurate. If a new problem present, please set up appointment sooner than planned  today.

## 2016-01-22 ENCOUNTER — Encounter: Payer: Self-pay | Admitting: Family Medicine

## 2016-01-22 ENCOUNTER — Other Ambulatory Visit (INDEPENDENT_AMBULATORY_CARE_PROVIDER_SITE_OTHER): Payer: 59

## 2016-01-22 DIAGNOSIS — R739 Hyperglycemia, unspecified: Secondary | ICD-10-CM | POA: Diagnosis not present

## 2016-01-22 LAB — BASIC METABOLIC PANEL WITH GFR
BUN: 9 mg/dL (ref 6–23)
CO2: 28 meq/L (ref 19–32)
Calcium: 8.8 mg/dL (ref 8.4–10.5)
Chloride: 105 meq/L (ref 96–112)
Creatinine, Ser: 0.79 mg/dL (ref 0.40–1.20)
GFR: 111.48 mL/min
Glucose, Bld: 89 mg/dL (ref 70–99)
Potassium: 4 meq/L (ref 3.5–5.1)
Sodium: 140 meq/L (ref 135–145)

## 2016-01-22 LAB — TSH: TSH: 3.31 u[IU]/mL (ref 0.35–4.50)

## 2016-01-25 ENCOUNTER — Encounter: Payer: Self-pay | Admitting: Family Medicine

## 2016-01-25 LAB — FRUCTOSAMINE: Fructosamine: 212 umol/L (ref 190–270)

## 2016-01-31 ENCOUNTER — Encounter: Payer: Self-pay | Admitting: Adult Health

## 2016-01-31 ENCOUNTER — Telehealth: Payer: Self-pay | Admitting: Family Medicine

## 2016-01-31 ENCOUNTER — Ambulatory Visit (INDEPENDENT_AMBULATORY_CARE_PROVIDER_SITE_OTHER): Payer: 59 | Admitting: Adult Health

## 2016-01-31 VITALS — BP 112/60 | Temp 98.6°F | Ht 64.0 in | Wt 303.8 lb

## 2016-01-31 DIAGNOSIS — R112 Nausea with vomiting, unspecified: Secondary | ICD-10-CM | POA: Diagnosis not present

## 2016-01-31 DIAGNOSIS — J069 Acute upper respiratory infection, unspecified: Secondary | ICD-10-CM

## 2016-01-31 MED ORDER — ONDANSETRON HCL 4 MG PO TABS: 4.0000 mg | ORAL_TABLET | Freq: Three times a day (TID) | ORAL | 1 refills | Status: DC | PRN

## 2016-01-31 NOTE — Telephone Encounter (Signed)
Pt saw Kandee KeenCory today. Pt would like a note for work that she was seen here today.  Would like to pick up as soon as possible. thanks

## 2016-01-31 NOTE — Telephone Encounter (Signed)
Note printed and handed to Tammy.

## 2016-01-31 NOTE — Progress Notes (Signed)
Subjective:    Patient ID: Valerie Green, female    DOB: 04/25/1986, 30 y.o.   MRN: [id number]  HPI  30 year old female who presents to the office today for " vomiting mucus" for the last 24 hours. She reports that it first happened 10-15 minutes after taking Melatonin for sleep. Throughout the night she vomited another 2 times. She has not had any vomiting episodes this afternoon but does endorse feeling nauseated.   She denies any blood in her vomit. Does not have any fevers or diarrhea and is not feeling acutely ill.   She does report that since 3 days ago she has had sinus congestion, sinus pressure, rhinorrhea and post nasal drip.   Review of Systems  Constitutional: Positive for fatigue. Negative for chills and fever.  HENT: Positive for congestion, postnasal drip, rhinorrhea, sinus pressure and sore throat. Negative for ear discharge, ear pain, trouble swallowing and voice change.   Respiratory: Negative.   Cardiovascular: Negative.   Gastrointestinal: Positive for nausea and vomiting. Negative for abdominal distention, abdominal pain, blood in stool and diarrhea.  Neurological: Positive for headaches.  All other systems reviewed and are negative.  Past Medical History:  Diagnosis Date  . ADD (attention deficit disorder)   . Allergy   . Anemia   . Anxiety state 09/07/2015  . Dysrhythmia   . Gastritis   . GERD (gastroesophageal reflux disease)   . Headache   . Pneumonia   . Pre-diabetes     Social History   Social History  . Marital status: Single    Spouse name: N/A  . Number of children: N/A  . Years of education: N/A   Occupational History  . Not on file.   Social History Main Topics  . Smoking status: Never Smoker  . Smokeless tobacco: Never Used  . Alcohol use No  . Drug use: No  . Sexual activity: Not on file   Other Topics Concern  . Not on file   Social History Narrative  . No narrative on file    Past Surgical History:  Procedure  Laterality Date  . CHOLECYSTECTOMY    . LAPAROSCOPIC CHOLECYSTECTOMY SINGLE SITE WITH INTRAOPERATIVE CHOLANGIOGRAM N/A 12/22/2015   Procedure: LAPAROSCOPIC CHOLECYSTECTOMY WITH INTRAOPERATIVE CHOLANGIOGRAM;  Surgeon: Ovidio Kinavid Newman, MD;  [facility];  Service: General;  Laterality: N/A;  . TOOTH EXTRACTION  2000    Family History  Problem Relation Age of Onset  . Diabetes Mother   . Diabetes Father   . Hyperlipidemia Brother   . Heart disease Maternal Aunt   . Diabetes Paternal Uncle     Allergies  Allergen Reactions  . Coffee Bean Extract [Coffea Arabica] Anaphylaxis  . Other Other (See Comments)    Chocolate, Garlic, spices, citrus cause gastritis  . Milk-Related Compounds Other (See Comments)    unknown    Current Outpatient Prescriptions on File Prior to Visit  Medication Sig Dispense Refill  . acetaminophen (TYLENOL) 500 MG tablet Take 500-1,000 mg by mouth every 6 (six) hours as needed for mild pain or headache.     . Ciclopirox 1 % shampoo Apply 1 application topically once a week.    Marland Kitchen. FLUOCINOLONE ACETONIDE BODY 0.01 % OIL Apply 1 application topically once a week.  1  . Melatonin ER 5 MG TBCR Take 5 mg by mouth at bedtime. 90 tablet 3  . omeprazole (PRILOSEC) 20 MG capsule Take 1 capsule (20 mg total) by mouth 2 (two) times daily before  a meal. (Patient taking differently: Take 20 mg by mouth 2 (two) times daily as needed (reflux). ) 30 capsule 0   No current facility-administered medications on file prior to visit.     BP 112/60   Temp 98.6 F (37 C) (Oral)   Ht 5\' 4"  (1.626 m)   Wt (!) 303 lb 12.8 oz (137.8 kg)   BMI 52.15 kg/m       Objective:   Physical Exam  Constitutional: She is oriented to person, place, and time. She appears well-developed and well-nourished. No distress.  HENT:  Head: Normocephalic and atraumatic.  Right Ear: External ear normal.  Left Ear: External ear normal.  Nose: Rhinorrhea present. Right sinus exhibits no maxillary sinus  tenderness and no frontal sinus tenderness. Left sinus exhibits no maxillary sinus tenderness and no frontal sinus tenderness.  Mouth/Throat: Oropharynx is clear and moist. No posterior oropharyngeal edema, posterior oropharyngeal erythema or tonsillar abscesses.  PND  Eyes: Conjunctivae and EOM are normal. Pupils are equal, round, and reactive to light. Right eye exhibits no discharge. Left eye exhibits no discharge. No scleral icterus.  Cardiovascular: Normal rate, regular rhythm, normal heart sounds and intact distal pulses.  Exam reveals no gallop and no friction rub.   No murmur heard. Pulmonary/Chest: Effort normal and breath sounds normal. No respiratory distress. She has no wheezes. She has no rales.  Neurological: She is alert and oriented to person, place, and time.  Skin: Skin is warm and dry. No rash noted. She is not diaphoretic. No erythema. No pallor.  Psychiatric: She has a normal mood and affect. Her behavior is normal. Thought content normal.  Nursing note and vitals reviewed.      Assessment & Plan:  1. URI (upper respiratory infection) - Advised Flonase and Claritin for symptom management  - Stay hydrated - Follow up if no improvement  2. Nausea and vomiting, vomiting of unspecified type _- I believe that her nausea and vomiting is stemming from PND from the URI.  - ondansetron (ZOFRAN) 4 MG tablet; Take 1 tablet (4 mg total) by mouth every 8 (eight) hours as needed for nausea or vomiting.  Dispense: 20 tablet; Refill: 1 - Follow up if no improvement after starting Flonase and Claritin for URI.   Wynona Neat

## 2016-02-12 ENCOUNTER — Ambulatory Visit: Payer: 59

## 2016-02-22 ENCOUNTER — Encounter: Payer: Self-pay | Admitting: Internal Medicine

## 2016-02-22 ENCOUNTER — Ambulatory Visit (INDEPENDENT_AMBULATORY_CARE_PROVIDER_SITE_OTHER): Payer: 59 | Admitting: Internal Medicine

## 2016-02-22 VITALS — BP 124/72 | HR 87 | Ht 64.0 in | Wt 307.8 lb

## 2016-02-22 DIAGNOSIS — F5101 Primary insomnia: Secondary | ICD-10-CM | POA: Diagnosis not present

## 2016-02-22 DIAGNOSIS — G4733 Obstructive sleep apnea (adult) (pediatric): Secondary | ICD-10-CM | POA: Diagnosis not present

## 2016-02-22 DIAGNOSIS — Z23 Encounter for immunization: Secondary | ICD-10-CM | POA: Diagnosis not present

## 2016-02-22 NOTE — Patient Instructions (Addendum)
Order- schedule split protocol NPSG    Dx OSA  My nurse will contact you once we know when the sleep study is scheduled for, so that we can bring you back to follow-up  Please call as needed  Flu vax

## 2016-02-22 NOTE — Progress Notes (Signed)
02/22/2016-30 year old female never smoker referred courtesy of Dr. Betty Swaziland. no prior sleep study. pt states it takes her anywhere from 1-2 hr to fall alseep, daytime sleepiness, wakes gasping for air & restless sleep. EPWORTH: 15 Witnessed apneas. Lives with parents. They noticed her snoring at times. Not aware of being significantly physically active during sleep. Avoids coffee "made my throat tight". No ENT surgery and no history of heart or lung disease except she says she may have had asthma in the past.  Prior to Admission medications   Medication Sig Start Date End Date Taking? Authorizing Provider  acetaminophen (TYLENOL) 500 MG tablet Take 500-1,000 mg by mouth every 6 (six) hours as needed for mild pain or headache.    Yes Historical Provider, MD  Ciclopirox 1 % shampoo Apply 1 application topically once a week.   Yes Historical Provider, MD  FLUOCINOLONE ACETONIDE BODY 0.01 % OIL Apply 1 application topically once a week. 12/01/15  Yes Historical Provider, MD  Melatonin ER 5 MG TBCR Take 5 mg by mouth at bedtime. 01/19/16  Yes Betty G Swaziland, MD  omeprazole (PRILOSEC) 20 MG capsule Take 1 capsule (20 mg total) by mouth 2 (two) times daily before a meal. Patient taking differently: Take 20 mg by mouth 2 (two) times daily as needed (reflux).  11/05/14  Yes Blake Divine, MD  ondansetron (ZOFRAN) 4 MG tablet Take 1 tablet (4 mg total) by mouth every 8 (eight) hours as needed for nausea or vomiting. 01/31/16  Yes Shirline Frees, NP   Past Medical History:  Diagnosis Date  . ADD (attention deficit disorder)   . Allergy   . Anemia   . Anxiety state 09/07/2015  . Dysrhythmia   . Gastritis   . GERD (gastroesophageal reflux disease)   . Headache   . Pneumonia   . Pre-diabetes    Past Surgical History:  Procedure Laterality Date  . CHOLECYSTECTOMY    . LAPAROSCOPIC CHOLECYSTECTOMY SINGLE SITE WITH INTRAOPERATIVE CHOLANGIOGRAM N/A 12/22/2015   Procedure: LAPAROSCOPIC CHOLECYSTECTOMY WITH  INTRAOPERATIVE CHOLANGIOGRAM;  Surgeon: Ovidio Kin, MD;  Location: [facility] OR;  Service: General;  Laterality: N/A;  . TOOTH EXTRACTION  2000   Family History  Problem Relation Age of Onset  . Diabetes Mother   . Diabetes Father   . Hyperlipidemia Brother   . Heart disease Maternal Aunt   . Diabetes Paternal Uncle    Social History   Social History  . Marital status: Single    Spouse name: N/A  . Number of children: N/A  . Years of education: N/A   Occupational History  . Not on file.   Social History Main Topics  . Smoking status: Never Smoker  . Smokeless tobacco: Never Used  . Alcohol use No  . Drug use: No  . Sexual activity: Not on file   Other Topics Concern  . Not on file   Social History Narrative  . No narrative on file   ROS-see HPI   Negative unless "+" Constitutional:    weight loss, night sweats, fevers, chills, +fatigue, lassitude. HEENT:    headaches, difficulty swallowing, tooth/dental problems, sore throat,       sneezing, itching, ear ache, nasal congestion, post nasal drip, snoring CV:    chest pain, orthopnea, PND, swelling in lower extremities, anasarca,  dizziness, palpitations Resp:   shortness of breath with exertion or at rest.                productive cough,   non-productive cough, coughing up of blood.              change in color of mucus.  wheezing.   Skin:    rash or lesions. GI:  No-   heartburn, indigestion, abdominal pain, nausea, vomiting, diarrhea,                 change in bowel habits, loss of appetite GU: dysuria, change in color of urine, no urgency or frequency.   flank pain. MS:   joint pain, stiffness, decreased range of motion, back pain. Neuro-     nothing unusual Psych:  change in mood or affect.  depression or anxiety.   memory loss.  OBJ- Physical Exam General- Alert, Oriented, Affect-appropriate, Distress- none acute,+ Morbid obesity Skin- rash-none, lesions- none,  excoriation- none Lymphadenopathy- none Head- atraumatic            Eyes- Gross vision intact, PERRLA, conjunctivae and secretions clear            Ears- Hearing, canals-normal            Nose- Clear, no-Septal dev, mucus, polyps, erosion, perforation             Throat- Mallampati II , mucosa clear , drainage- none, tonsils- atrophic Neck- flexible , trachea midline, no stridor , thyroid nl, carotid no bruit Chest - symmetrical excursion , unlabored           Heart/CV- RRR , no murmur , no gallop  , no rub, nl s1 s2                           - JVD- none , edema- none, stasis changes- none, varices- none           Lung- clear to P&A, wheeze- none, cough- none , dullness-none, rub- none           Chest wall-  Abd-  Br/ Gen/ Rectal- Not done, not indicated Extrem- cyanosis- none, clubbing, none, atrophy- none, strength- nl Neuro- grossly intact to observation  .

## 2016-02-23 DIAGNOSIS — Z23 Encounter for immunization: Secondary | ICD-10-CM | POA: Diagnosis not present

## 2016-02-23 DIAGNOSIS — G4733 Obstructive sleep apnea (adult) (pediatric): Secondary | ICD-10-CM | POA: Insufficient documentation

## 2016-02-23 NOTE — Assessment & Plan Note (Signed)
Probable diagnosis based on history and exam. Plan-discussed and will schedule sleep study

## 2016-02-23 NOTE — Assessment & Plan Note (Signed)
Describes difficulty initiating and maintaining sleep with sleep latency often 2 or 3 hours. Melatonin some help. Plan-reviewed basic good sleep hygiene.

## 2016-03-06 ENCOUNTER — Ambulatory Visit (HOSPITAL_BASED_OUTPATIENT_CLINIC_OR_DEPARTMENT_OTHER): Payer: 59 | Attending: Internal Medicine | Admitting: Internal Medicine

## 2016-03-06 VITALS — Ht 64.0 in | Wt 310.0 lb

## 2016-03-06 DIAGNOSIS — E669 Obesity, unspecified: Secondary | ICD-10-CM | POA: Diagnosis not present

## 2016-03-06 DIAGNOSIS — Z6841 Body Mass Index (BMI) 40.0 and over, adult: Secondary | ICD-10-CM | POA: Insufficient documentation

## 2016-03-06 DIAGNOSIS — R5383 Other fatigue: Secondary | ICD-10-CM | POA: Insufficient documentation

## 2016-03-06 DIAGNOSIS — R0683 Snoring: Secondary | ICD-10-CM | POA: Insufficient documentation

## 2016-03-06 DIAGNOSIS — G4733 Obstructive sleep apnea (adult) (pediatric): Secondary | ICD-10-CM

## 2016-03-06 DIAGNOSIS — R51 Headache: Secondary | ICD-10-CM | POA: Diagnosis not present

## 2016-03-12 ENCOUNTER — Encounter (HOSPITAL_BASED_OUTPATIENT_CLINIC_OR_DEPARTMENT_OTHER): Payer: Self-pay | Admitting: *Deleted

## 2016-03-16 DIAGNOSIS — G4733 Obstructive sleep apnea (adult) (pediatric): Secondary | ICD-10-CM

## 2016-03-16 NOTE — Procedures (Signed)
  Patient Name: Valerie [name], Valerie [name] Date: 03/06/2016 Gender: Female D.O.B: [birth year] Age (years): 30 Referring Provider: Jetty Duhamellinton Didier Brandenburg MD, ABSM Height (inches): 64 Interpreting Physician: Jetty Duhamellinton Sherrina Zaugg MD, ABSM Weight (lbs): 310 RPSGT: Ulyess MortSpruill, Vicki BMI: 53 MRN: [id number] Neck Size: 14.50 CLINICAL INFORMATION Valerie [name] Type: NPSG Indication for Valerie [name]: Excessive Daytime Sleepiness, Fatigue, Morning Headaches, Obesity, OSA, Snoring Epworth Sleepiness Score: 18  Valerie [name] TECHNIQUE As per the AASM Manual for the Scoring of Valerie and Associated Events v2.3 (April 2016) with a hypopnea requiring 4% desaturations. The channels recorded and monitored were frontal, central and occipital EEG, electrooculogram (EOG), submentalis EMG (chin), nasal and oral airflow, thoracic and abdominal wall motion, anterior tibialis EMG, snore microphone, electrocardiogram, and pulse oximetry.  MEDICATIONS Medications self-administered by patient taken the night of the [name] : MELATONIN  Valerie ARCHITECTURE The [name] was initiated at 11:09:21 PM and ended at 5:12:38 AM. Valerie onset time was 4.3 minutes and the Valerie efficiency was 90.6%. The total Valerie time was 329.0 minutes. Stage REM latency was 179.0 minutes. The patient spent 10.79% of the night in stage N1 Valerie, 74.77% in stage N2 Valerie, 0.00% in stage N3 and 14.44% in REM. Alpha intrusion was absent. Supine Valerie was 76.76%.  RESPIRATORY PARAMETERS The overall apnea/hypopnea index (AHI) was 2.0 per hour. There were 1 total apneas, including 0 obstructive, 0 central and 1 mixed apneas. There were 10 hypopneas and 18 RERAs. The AHI during Stage REM Valerie was 2.5 per hour. AHI while supine was 2.4 per hour. The mean oxygen saturation was 97.13%. The minimum SpO2 during Valerie was 92.00%. Moderate snoring was noted during this [name].  CARDIAC DATA The 2 lead EKG demonstrated sinus rhythm. The mean heart rate was 63.08 beats per  minute. Other EKG findings include: None.  LEG MOVEMENT DATA The total PLMS were 0 with a resulting PLMS index of 0.00. Associated arousal with leg movement index was 0.0 .  IMPRESSIONS - No significant obstructive Valerie apnea occurred during this [name] (AHI = 2.0/h). - No significant central Valerie apnea occurred during this [name] (CAI = 0.0/h). - The patient had minimal or no oxygen desaturation during the [name] (Min O2 = 92.00%) - The patient snored with Moderate snoring volume. - No cardiac abnormalities were noted during this [name]. - Clinically significant periodic limb movements did not occur during Valerie. No significant associated arousals.  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 - Valerie hygiene should be reviewed to assess factors that may improve Valerie quality. - Weight management and regular exercise should be initiated or continued if appropriate.  [Electronically signed] 03/16/2016 12:18 PM  Jetty Duhamellinton Havanah Nelms MD, ABSM Diplomate, American Board of Valerie Medicine   NPI: [id number]  Waymon BudgeYOUNG,Leston Schueller D Diplomate, American Board of Valerie Medicine  ELECTRONICALLY SIGNED ON:  03/16/2016, 12:17 PM  [facility] PH: [phone]   FX: [phone] ACCREDITED BY THE AMERICAN ACADEMY OF Valerie MEDICINE

## 2016-04-09 ENCOUNTER — Encounter (HOSPITAL_BASED_OUTPATIENT_CLINIC_OR_DEPARTMENT_OTHER): Payer: 59

## 2016-04-13 ENCOUNTER — Other Ambulatory Visit: Payer: Self-pay | Admitting: Adult Health

## 2016-04-13 DIAGNOSIS — R112 Nausea with vomiting, unspecified: Secondary | ICD-10-CM

## 2016-04-16 ENCOUNTER — Other Ambulatory Visit: Payer: Self-pay | Admitting: Adult Health

## 2016-04-16 DIAGNOSIS — R112 Nausea with vomiting, unspecified: Secondary | ICD-10-CM

## 2016-04-22 ENCOUNTER — Ambulatory Visit (INDEPENDENT_AMBULATORY_CARE_PROVIDER_SITE_OTHER): Payer: 59 | Admitting: Family Medicine

## 2016-04-22 ENCOUNTER — Encounter: Payer: Self-pay | Admitting: Family Medicine

## 2016-04-22 VITALS — BP 120/80 | HR 76 | Resp 12 | Ht 64.0 in | Wt 318.1 lb

## 2016-04-22 DIAGNOSIS — Z6841 Body Mass Index (BMI) 40.0 and over, adult: Secondary | ICD-10-CM

## 2016-04-22 DIAGNOSIS — R110 Nausea: Secondary | ICD-10-CM

## 2016-04-22 NOTE — Progress Notes (Signed)
Pre visit review using our clinic review tool, if applicable. No additional management support is needed unless otherwise documented below in the visit note. 

## 2016-04-22 NOTE — Progress Notes (Signed)
HPI:   ValerieValerie Green is a [age] y.o. female, who is here today to follow on some of problems addressed last OV. She was last seen 01/19/16.  Since her last OV abdominal pain has resolved, s/p cholecystectomy. Still having mild nausea and occasionally loose stools, usually with certain foods like bacon.  She has not made significant changes in her diet sine her last OV.  She is trying protein shakes for the past 2 weeks and usually for lunch, helps with sweet craving. Oatmeal for breakfast x 2. Dinner varies, fast food until 03/14/16, since school break  She is doing protein shake, salad, or chicken (fried).  She is not exercising regularly.   Insomnia: Melatonin ER 5 mg is helping.  She wakes up a couple times because she is hot,sweating. She states that her father keeps heater up in the house.  Fatigue improved.    Review of Systems  Constitutional: Negative for appetite change, fatigue and unexpected weight change.  HENT: Negative for mouth sores, nosebleeds and trouble swallowing.   Respiratory: Negative for cough, shortness of breath and wheezing.   Cardiovascular: Negative for chest pain, palpitations and leg swelling.  Gastrointestinal: Positive for nausea. Negative for abdominal pain and vomiting.  Genitourinary: Negative for decreased urine volume and difficulty urinating.       LMP 03/22/16.  Musculoskeletal: Negative for back pain and myalgias.  Neurological: Negative for syncope, weakness and headaches.  Psychiatric/Behavioral: Negative for sleep disturbance. The patient is not nervous/anxious.       Current Outpatient Prescriptions on File Prior to Visit  Medication Sig Dispense Refill  . acetaminophen (TYLENOL) 500 MG tablet Take 500-1,000 mg by mouth every 6 (six) hours as needed for mild pain or headache.     . Ciclopirox 1 % shampoo Apply 1 application topically once a week.    Marland Kitchen. FLUOCINOLONE ACETONIDE BODY 0.01 % OIL Apply 1 application  topically once a week.  1  . Melatonin ER 5 MG TBCR Take 5 mg by mouth at bedtime. 90 tablet 3  . omeprazole (PRILOSEC) 20 MG capsule Take 1 capsule (20 mg total) by mouth 2 (two) times daily before a meal. (Patient taking differently: Take 20 mg by mouth 2 (two) times daily as needed (reflux). ) 30 capsule 0  . ondansetron (ZOFRAN) 4 MG tablet Take 1 tablet (4 mg total) by mouth every 8 (eight) hours as needed for nausea or vomiting. 20 tablet 1   No current facility-administered medications on file prior to visit.      Past Medical History:  Diagnosis Date  . ADD (attention deficit disorder)   . Allergy   . Anemia   . Anxiety state 09/07/2015  . Dysrhythmia   . Gastritis   . GERD (gastroesophageal reflux disease)   . Headache   . Pneumonia   . Pre-diabetes    Allergies  Allergen Reactions  . Coffee Bean Extract [Coffea Arabica] Anaphylaxis  . Other Other (See Comments)    Chocolate, Garlic, spices, citrus cause gastritis  . Milk-Related Compounds Other (See Comments)    unknown    Social History   Social History  . Marital status: Single    Spouse name: N/A  . Number of children: N/A  . Years of education: N/A   Social History Main Topics  . Smoking status: Never Smoker  . Smokeless tobacco: Never Used  . Alcohol use No  . Drug use: No  . Sexual activity: Not Asked  Other Topics Concern  . None   Social History Narrative  . None    Vitals:   04/22/16 0812  BP: 120/80  Pulse: 76  Resp: 12   Body mass index is 54.61 kg/m.   Wt Readings from Last 3 Encounters:  04/22/16 (!) 318 lb 2 oz (144.3 kg)  03/06/16 (!) 310 lb (140.6 kg)  02/22/16 (!) 307 lb 12.8 oz (139.6 kg)      Physical Exam  Nursing note and vitals reviewed. Constitutional: She is oriented to person, place, and time. She appears well-developed. No distress.  HENT:  Head: Atraumatic.  Mouth/Throat: Oropharynx is clear and moist and mucous membranes are normal.  Eyes: Conjunctivae  and EOM are normal.  Cardiovascular: Normal rate and regular rhythm.   No murmur heard. Pulses:      Dorsalis pedis pulses are 2+ on the right side, and 2+ on the left side.  Respiratory: Effort normal and breath sounds normal. No respiratory distress.  GI: Soft. She exhibits no mass. There is no hepatomegaly. There is no tenderness.  Musculoskeletal: She exhibits edema (trace pitting edema LE, bilateral).  Neurological: She is alert and oriented to person, place, and time. She has normal strength. Coordination and gait normal.  Skin: Skin is warm. No erythema.  Psychiatric: She has a normal mood and affect.  Well groomed, good eye contact.      ASSESSMENT AND PLAN:     Valerie Green was seen today for follow-up.  Diagnoses and all orders for this visit:  BMI 50.0-59.9, adult (HCC)  Gaining wt steadily.  We discussed benefits of wt loss as well as adverse effects of obesity. Consistency with healthy diet and physical activity recommended. We discussed her diet and gave some recommendations in regard to meals. Eggs x 2 for breakfast, bake instead frying, avoid fast food. Daily brisk walking for 15-30 min as tolerated.  Nausea without vomiting  Improved. Avoid food that trigger symptoms. F/U as needed.      -Valerie Green was advised to return sooner than planned today if new concerns arise.       Betty G. SwazilandJordan, MD  [facility]. [place] office.

## 2016-04-22 NOTE — Patient Instructions (Signed)
A few things to remember from today's visit:   BMI 50.0-59.9, adult [facility](HCC)  What are some tips for weight loss? People become overweight for many reasons. Weight issues can run in families. They can be caused by unhealthy behaviors and a person's environment. Certain health problems and medicines can also lead to weight gain. There are some simple things you can do to reach and maintain a healthy weight:  Eat small more frequent healthy meals instead 3 bid meals. Also Weight Watchers is a good option. Avoid sweet drinks. These include regular soft drinks, fruit juices, fruit drinks, energy drinks, sweetened iced tea, and flavored milk. Avoid fast foods. Fast foods such as french fries, hamburgers, chicken nuggets, and pizza are high in calories and can cause weight gain. Eat a healthy breakfast. People who skip breakfast tend to weigh more. Don't watch more than two hours of television per day. Chew sugar-free gum between meals to cut down on snacking. Avoid grocery shopping when you're hungry. Pack a healthy lunch instead of eating out to control what and how much you eat. Eat a lot of fruits and vegetables. Aim for about 2 cups of fruit and 2 to 3 cups of vegetables per day. Aim for 150 minutes per week of moderate-intensity exercise (such as brisk walking), or 75 minutes per week of vigorous exercise (such as jogging or running). OR 15-30 min of daily brisk walking. Be more active. Small changes in physical activity can easily be added to your daily routine. For example, take the stairs instead of the elevator. Take a walk with your family. A daily walk is a great way to get exercise and to catch up on the day's events.   Please be sure medication list is accurate. If a new problem present, please set up appointment sooner than planned today.

## 2016-04-30 ENCOUNTER — Ambulatory Visit (HOSPITAL_COMMUNITY)
Admission: EM | Admit: 2016-04-30 | Discharge: 2016-04-30 | Disposition: A | Discharge status: Home / Self Care | Payer: 59 | Attending: Emergency Medicine | Admitting: Emergency Medicine

## 2016-04-30 ENCOUNTER — Encounter (HOSPITAL_COMMUNITY): Payer: Self-pay | Admitting: Emergency Medicine

## 2016-04-30 DIAGNOSIS — R05 Cough: Secondary | ICD-10-CM | POA: Diagnosis not present

## 2016-04-30 DIAGNOSIS — R0982 Postnasal drip: Secondary | ICD-10-CM | POA: Diagnosis not present

## 2016-04-30 DIAGNOSIS — R059 Cough, unspecified: Secondary | ICD-10-CM

## 2016-04-30 MED ORDER — PHENYLEPHRINE-CHLORPHEN-DM 10-4-12.5 MG/5ML PO LIQD: 5.0000 mL | ORAL | 0 refills | Status: DC | PRN

## 2016-04-30 NOTE — ED Provider Notes (Signed)
CSN: [id number]     Arrival date & time 04/30/16  1719 History   First MD Initiated Contact with Patient 04/30/16 1818     Chief Complaint  Patient presents with  . Cough   (Consider location/radiation/quality/duration/timing/severity/associated sxs/prior Treatment) 30 year old female presents with a chief complaint of cough for 4 days. Occasional chest pain associated with cough. Possible PND and she describes some rattling in the back of her throat and upper mid chest. Denies fevers, earache, painful throat. She has tried NyQuil without persistent and sufficient relief.      Past Medical History:  Diagnosis Date  . ADD (attention deficit disorder)   . Allergy   . Anemia   . Anxiety state 09/07/2015  . Dysrhythmia   . Gastritis   . GERD (gastroesophageal reflux disease)   . Headache   . Pneumonia   . Pre-diabetes    Past Surgical History:  Procedure Laterality Date  . CHOLECYSTECTOMY    . LAPAROSCOPIC CHOLECYSTECTOMY SINGLE SITE WITH INTRAOPERATIVE CHOLANGIOGRAM N/A 12/22/2015   Procedure: LAPAROSCOPIC CHOLECYSTECTOMY WITH INTRAOPERATIVE CHOLANGIOGRAM;  Surgeon: Ovidio Kinavid Newman, MD;  [facility];  Service: General;  Laterality: N/A;  . TOOTH EXTRACTION  2000   Family History  Problem Relation Age of Onset  . Diabetes Mother   . Diabetes Father   . Hyperlipidemia Brother   . Heart disease Maternal Aunt   . Diabetes Paternal Uncle    Social History  Substance Use Topics  . Smoking status: Never Smoker  . Smokeless tobacco: Never Used  . Alcohol use No   OB History    No data available     Review of Systems  Constitutional: Negative for activity change, appetite change, chills, fatigue and fever.  HENT: Positive for congestion and postnasal drip. Negative for ear pain, facial swelling, rhinorrhea and trouble swallowing.   Eyes: Negative.   Respiratory: Positive for cough. Negative for shortness of breath.   Cardiovascular: Positive for chest pain.   Gastrointestinal: Negative.   Musculoskeletal: Negative for neck pain and neck stiffness.  Skin: Negative for pallor and rash.  Neurological: Negative.     Allergies  Coffee bean extract [coffea arabica]; Other; and Milk-related compounds  Home Medications   Prior to Admission medications   Medication Sig Start Date End Date Taking? Authorizing Provider  omeprazole (PRILOSEC) 20 MG capsule Take 1 capsule (20 mg total) by mouth 2 (two) times daily before a meal. Patient taking differently: Take 20 mg by mouth 2 (two) times daily as needed (reflux).  11/05/14  Yes Blake DivineJohn Wofford, MD  FLUOCINOLONE ACETONIDE BODY 0.01 % OIL Apply 1 application topically once a week. 12/01/15   Historical Provider, MD  Phenylephrine-Chlorphen-DM 02-07-11.5 MG/5ML LIQD Take 5 mLs by mouth every 4 (four) hours as needed. 04/30/16   Hayden Rasmussenavid Thia Olesen, NP   Meds Ordered and Administered this Visit  Medications - No data to display  BP 129/80 (BP Location: Left Arm)   Pulse 83   Temp 99.3 F (37.4 C) (Oral)   Resp 16   LMP 03/22/2016 (Exact Date)   SpO2 98%  No data found.   Physical Exam  Constitutional: She is oriented to person, place, and time. She appears well-developed and well-nourished. No distress.  HENT:  Head: Normocephalic and atraumatic.  Right Ear: External ear normal.  Left Ear: External ear normal.  Mouth/Throat: No oropharyngeal exudate.  Oropharynx injected with mild to moderate erythema. No exudates or swelling. Small amount of clear PND.  Bilateral TMs are normal.  Eyes: EOM are normal.  Neck: Normal range of motion. Neck supple.  Cardiovascular: Normal rate, regular rhythm and normal heart sounds.   Pulmonary/Chest: Effort normal and breath sounds normal. No respiratory distress. She has no wheezes. She has no rales.  Musculoskeletal: Normal range of motion. She exhibits no edema.  Lymphadenopathy:    She has no cervical adenopathy.  Neurological: She is alert and oriented to person,  place, and time.  Skin: Skin is warm and dry.  Psychiatric: She has a normal mood and affect.  Nursing note and vitals reviewed.   Urgent Care Course   Clinical Course     Procedures (including critical care time)  Labs Review Labs Reviewed - No data to display  Imaging Review No results found.   Visual Acuity Review  Right Eye Distance:   Left Eye Distance:   Bilateral Distance:    Right Eye Near:   Left Eye Near:    Bilateral Near:         MDM   1. Cough   2. PND (post-nasal drip)    The most likely reason for your cough is the drainage from your sinuses going back of your throat. Your lungs are perfectly clear. Not able to hear any sign of wheezing or bronchospasm. For discomfort in the throat he may use Cepacol lozenges which will help numb the throat. Robitussin-DM for cough during the daytime as well as Allegra 180 mg to help with drainage. You are receiving a liquid cough and drainage medication prescription that can cause drowsiness to treat the cough and drainage. It is best to use this at home and at nighttime. You should drink plenty of cold fluids and stay well-hydrated. Meds ordered this encounter  Medications  . Phenylephrine-Chlorphen-DM 02-07-11.5 MG/5ML LIQD    Sig: Take 5 mLs by mouth every 4 (four) hours as needed.    Dispense:  120 mL    Refill:  0    Order Specific Question:   Supervising Provider    Answer:   Charm RingsHONIG, ERIN J [[id number]]      Hayden Rasmussenavid Analleli Gierke, NP 04/30/16 [phone]

## 2016-04-30 NOTE — ED Triage Notes (Signed)
[facility] with a complaint of a cough x 5 days.

## 2016-04-30 NOTE — Discharge Instructions (Signed)
The most likely reason for your cough is the drainage from your sinuses going back of your throat. Your lungs are perfectly clear. Not able to hear any sign of wheezing or bronchospasm. For discomfort in the throat he may use Cepacol lozenges which will help numb the throat. Robitussin-DM for cough during the daytime as well as Allegra 180 mg to help with drainage. You are receiving a liquid cough and drainage medication prescription that can cause drowsiness to treat the cough and drainage. It is best to use this at home and at nighttime. You should drink plenty of cold fluids and stay well-hydrated.

## 2016-05-21 ENCOUNTER — Ambulatory Visit (HOSPITAL_COMMUNITY)
Admission: EM | Admit: 2016-05-21 | Discharge: 2016-05-21 | Disposition: A | Discharge status: Home / Self Care | Payer: 59 | Attending: Family Medicine | Admitting: Family Medicine

## 2016-05-21 ENCOUNTER — Encounter (HOSPITAL_COMMUNITY): Payer: Self-pay | Admitting: Family Medicine

## 2016-05-21 DIAGNOSIS — N3090 Cystitis, unspecified without hematuria: Secondary | ICD-10-CM | POA: Diagnosis not present

## 2016-05-21 DIAGNOSIS — N760 Acute vaginitis: Secondary | ICD-10-CM

## 2016-05-21 DIAGNOSIS — N898 Other specified noninflammatory disorders of vagina: Secondary | ICD-10-CM | POA: Insufficient documentation

## 2016-05-21 MED ORDER — METRONIDAZOLE 500 MG PO TABS: 500.0000 mg | ORAL_TABLET | Freq: Two times a day (BID) | ORAL | 0 refills | Status: DC

## 2016-05-21 MED ORDER — CIPROFLOXACIN HCL 500 MG PO TABS: 500.0000 mg | ORAL_TABLET | Freq: Two times a day (BID) | ORAL | 0 refills | Status: DC

## 2016-05-21 NOTE — Discharge Instructions (Signed)
Take all of medicine as directed, drink lots of fluids, see your doctor if further problems. We will call if tests show something else needs treatment

## 2016-05-21 NOTE — ED Provider Notes (Signed)
[facility]    CSN: [id number] Arrival date & time: 05/21/16  1728     History   Chief Complaint Chief Complaint  Patient presents with  . Vaginal Discharge  . Dysuria    HPI Valerie Green is a 31 y.o. female.   The history is provided by the patient.  Vaginal Discharge  Quality:  Malodorous Severity:  Moderate Onset quality:  Sudden Duration:  1 day Progression:  Worsening Chronicity:  New Context: not during bowel movement   Context comment:  After wiping from bm Relieved by:  None tried Worsened by:  Nothing Associated symptoms: dysuria   Dysuria  Associated symptoms: vaginal discharge   Associated symptoms: no flank pain     Past Medical History:  Diagnosis Date  . ADD (attention deficit disorder)   . Allergy   . Anemia   . Anxiety state 09/07/2015  . Dysrhythmia   . Gastritis   . GERD (gastroesophageal reflux disease)   . Headache   . Pneumonia   . Pre-diabetes     Patient Active Problem List   Diagnosis Date Noted  . Obstructive sleep apnea 02/23/2016  . Insomnia 01/19/2016  . BMI 50.0-59.9, adult (HCC) 01/19/2016  . S/P laparoscopic cholecystectomy August 2017 12/23/2015  . Cholecystitis 12/22/2015  . Anxiety state 09/07/2015    Past Surgical History:  Procedure Laterality Date  . CHOLECYSTECTOMY    . LAPAROSCOPIC CHOLECYSTECTOMY SINGLE SITE WITH INTRAOPERATIVE CHOLANGIOGRAM N/A 12/22/2015   Procedure: LAPAROSCOPIC CHOLECYSTECTOMY WITH INTRAOPERATIVE CHOLANGIOGRAM;  Surgeon: Ovidio Kin, MD;  Location: [facility];  Service: General;  Laterality: N/A;  . TOOTH EXTRACTION  2000    OB History    No data available       Home Medications    Prior to Admission medications   Medication Sig Start Date End Date Taking? Authorizing Provider  ciprofloxacin (CIPRO) 500 MG tablet Take 1 tablet (500 mg total) by mouth every 12 (twelve) hours. 05/21/16   Linna Hoff, MD  FLUOCINOLONE ACETONIDE BODY 0.01 % OIL Apply 1 application  topically once a week. 12/01/15   Historical Provider, MD  metroNIDAZOLE (FLAGYL) 500 MG tablet Take 1 tablet (500 mg total) by mouth 2 (two) times daily. 05/21/16   Linna Hoff, MD  omeprazole (PRILOSEC) 20 MG capsule Take 1 capsule (20 mg total) by mouth 2 (two) times daily before a meal. Patient taking differently: Take 20 mg by mouth 2 (two) times daily as needed (reflux).  11/05/14   Blake Divine, MD  Phenylephrine-Chlorphen-DM 02-07-11.5 MG/5ML LIQD Take 5 mLs by mouth every 4 (four) hours as needed. 04/30/16   Hayden Rasmussen, NP    Family History Family History  Problem Relation Age of Onset  . Diabetes Mother   . Diabetes Father   . Hyperlipidemia Brother   . Heart disease Maternal Aunt   . Diabetes Paternal Uncle     Social History Social History  Substance Use Topics  . Smoking status: Never Smoker  . Smokeless tobacco: Never Used  . Alcohol use No     Allergies   Coffee bean extract [coffea arabica]; Other; and Milk-related compounds   Review of Systems Review of Systems  Constitutional: Negative.   Gastrointestinal: Negative.   Genitourinary: Positive for dysuria and vaginal discharge. Negative for flank pain, frequency and pelvic pain.     Physical Exam Triage Vital Signs ED Triage Vitals [05/21/16 1757]  Enc Vitals Group     BP 145/85     Pulse  Rate (!) 57     Resp 18     Temp 98.5 F (36.9 C)     Temp src      SpO2 100 %     Weight      Height      Head Circumference      Peak Flow      Pain Score      Pain Loc      Pain Edu?      Excl. in GC?    No data found.   Updated Vital Signs BP 145/85   Pulse (!) 57   Temp 98.5 F (36.9 C)   Resp 18   LMP 05/15/2016   SpO2 100%   Visual Acuity Right Eye Distance:   Left Eye Distance:   Bilateral Distance:    Right Eye Near:   Left Eye Near:    Bilateral Near:     Physical Exam  Constitutional: She is oriented to person, place, and time. She appears well-developed and well-nourished. No  distress.  Abdominal: Soft. Bowel sounds are normal. There is no tenderness.  Neurological: She is alert and oriented to person, place, and time.  Skin: Skin is warm and dry.  Nursing note and vitals reviewed.    UC Treatments / Results  Labs (all labs ordered are listed, but only abnormal results are displayed) Labs Reviewed  URINE CYTOLOGY ANCILLARY ONLY  URINE CYTOLOGY ANCILLARY ONLY    EKG  EKG Interpretation None       Radiology No results found.  Procedures Procedures (including critical care time)  Medications Ordered in UC Medications - No data to display   Initial Impression / Assessment and Plan / UC Course  I have reviewed the triage vital signs and the nursing notes.  Pertinent labs & imaging results that were available during my care of the patient were reviewed by me and considered in my medical decision making (see chart for details).       Final Clinical Impressions(s) / UC Diagnoses   Final diagnoses:  Acute vaginitis  Cystitis    New Prescriptions Discharge Medication List as of 05/21/2016  6:19 PM    START taking these medications   Details  ciprofloxacin (CIPRO) 500 MG tablet Take 1 tablet (500 mg total) by mouth every 12 (twelve) hours., Starting Tue 05/21/2016, Normal    metroNIDAZOLE (FLAGYL) 500 MG tablet Take 1 tablet (500 mg total) by mouth 2 (two) times daily., Starting Tue 05/21/2016, Normal         Linna HoffJames D Dianah Pruett, MD 06/04/16 2155

## 2016-05-21 NOTE — ED Triage Notes (Signed)
Pt sts that yesterday she had a BM and accidentally wiped from back to front and then started wiping a lot to clean up. sts then she noticed some bleeding and irritation. sts bleeding with urination, foul smell and vaginal discharge.

## 2016-05-23 LAB — URINE CYTOLOGY ANCILLARY ONLY
CHLAMYDIA, DNA PROBE: NEGATIVE
NEISSERIA GONORRHEA: NEGATIVE
Trichomonas: NEGATIVE

## 2016-05-27 LAB — URINE CYTOLOGY ANCILLARY ONLY: Bacterial vaginitis: NEGATIVE

## 2016-06-07 ENCOUNTER — Encounter: Payer: Self-pay | Admitting: Family Medicine

## 2016-10-20 NOTE — Progress Notes (Signed)
HPI:   Valerie Green is a 31 y.o. female, who is here today for 6 months follow up.   She was last seen on 04/22/16. Since her last OV she has been in the ER twice: 04/30/16 and 05/21/16.  Obesity:  Dietary changes since her last OV: Baking more, drinking mostly water but still drinks sweet teat q 4 days, ginger el when GI issues, once a month. Breakfast: banana, oatmeal, and yogurt. Sometimes she gets hungry before lunch and snacks on rice cake. Still eating out when she has classes but in general less frequent.  Exercise: For the past months exercising q 2 days, DVD cardio.  She has Hx of depression and anxiety, she does not feel like symptoms are severe enough to take medication. Symptoms were worse during teen years.  She denies suicidal thoughts but has had some years ago, 2008. She lives with her parents.  Evaluated for OSA and sleep study was negative.   Hyperglycemia HgA1C 5.9 12/2015. GLU 130 in 12/2015, prior numbers: 914,782,956104,138,126. She denies Hx of DM but rather prediabetes. Fructosamine 212 in 01/2016.    Lab Results  Component Value Date   TSH 3.31 01/22/2016     Review of Systems  Constitutional: Negative for activity change, appetite change, fatigue, fever and unexpected weight change.  HENT: Negative for mouth sores, nosebleeds and trouble swallowing.   Eyes: Negative for redness and visual disturbance.  Respiratory: Negative for shortness of breath and wheezing.   Cardiovascular: Negative for palpitations and leg swelling.  Gastrointestinal: Negative for abdominal pain, nausea and vomiting.       Negative for changes in bowel habits.  Endocrine: Negative for polydipsia, polyphagia and polyuria.  Genitourinary: Positive for menstrual problem (30-60 days menstrual cycles). Negative for decreased urine volume and hematuria.       LMP 09/17/16, denies sexual activity  Musculoskeletal: Negative for gait problem and myalgias.  Neurological:  Negative for syncope, weakness and headaches.  Psychiatric/Behavioral: Negative for confusion. The patient is nervous/anxious.      Current Outpatient Prescriptions on File Prior to Visit  Medication Sig Dispense Refill  . FLUOCINOLONE ACETONIDE BODY 0.01 % OIL Apply 1 application topically once a week.  1  . omeprazole (PRILOSEC) 20 MG capsule Take 1 capsule (20 mg total) by mouth 2 (two) times daily before a meal. (Patient taking differently: Take 20 mg by mouth 2 (two) times daily as needed (reflux). ) 30 capsule 0   No current facility-administered medications on file prior to visit.      Past Medical History:  Diagnosis Date  . ADD (attention deficit disorder)   . Allergy   . Anemia   . Anxiety state 09/07/2015  . Dysrhythmia   . Gastritis   . GERD (gastroesophageal reflux disease)   . Headache   . Pneumonia   . Pre-diabetes    Allergies  Allergen Reactions  . Coffee Bean Extract [Coffea Arabica] Anaphylaxis  . Other Other (See Comments)    Chocolate, Garlic, spices, citrus cause gastritis  . Milk-Related Compounds Other (See Comments)    unknown    Social History   Social History  . Marital status: Single    Spouse name: N/A  . Number of children: N/A  . Years of education: N/A   Social History Main Topics  . Smoking status: Never Smoker  . Smokeless tobacco: Never Used  . Alcohol use No  . Drug use: No  . Sexual activity: Not Asked  Other Topics Concern  . None   Social History Narrative  . None    Vitals:   10/21/16 0915  BP: 130/80  Pulse: 80  Resp: 12   Body mass index is 55.19 kg/m.   Wt Readings from Last 3 Encounters:  10/21/16 (!) 321 lb 8 oz (145.8 kg)  04/22/16 (!) 318 lb 2 oz (144.3 kg)  03/06/16 (!) 310 lb (140.6 kg)    Physical Exam  Nursing note and vitals reviewed. Constitutional: She is oriented to person, place, and time. She appears well-developed. No distress.  HENT:  Head: Atraumatic.  Mouth/Throat: Oropharynx is  clear and moist and mucous membranes are normal.  Eyes: Conjunctivae and EOM are normal. Pupils are equal, round, and reactive to light.  Neck: No tracheal deviation present. No thyroid mass and no thyromegaly present.  Cardiovascular: Normal rate and regular rhythm.   No murmur heard. Pulses:      Dorsalis pedis pulses are 2+ on the right side, and 2+ on the left side.  Respiratory: Effort normal and breath sounds normal. No respiratory distress.  GI: Soft. She exhibits no mass. There is no hepatomegaly. There is no tenderness.  Musculoskeletal: She exhibits no edema.  Lymphadenopathy:    She has no cervical adenopathy.  Neurological: She is alert and oriented to person, place, and time. She has normal strength. Coordination and gait normal.  Skin: Skin is warm. No erythema.  Psychiatric: She has a normal mood and affect.  Well groomed, good eye contact.      ASSESSMENT AND PLAN:   Ms. Valerie Green was seen today for 6 months follow-up.   [name] was seen today for follow-up.  Diagnoses and all orders for this visit:  BMI 50.0-59.9, adult (HCC)  Gaining wt steadily. We discussed benefits of wt loss as well as adverse effects of obesity. Consistency with healthy diet and physical activity recommended. 150 min of weekly moderate intensity exercise. We discussed her food choices and some healthier options given. Wellbutrin may help.   -     buPROPion (WELLBUTRIN SR) 100 MG 12 hr tablet; Take 1 tablet (100 mg total) by mouth 2 (two) times daily. -     POCT glycosylated hemoglobin (Hb A1C)  Anxiety state  She also has some remote Hx of ADD. Wellbutrin recommended, she understands side effects. Instructed to let me know in 4-6 weeks if she is tolerating medications well, before if needed. Instructed about warning signs. F/U in 2 months.  -     buPROPion (WELLBUTRIN SR) 100 MG 12 hr tablet; Take 1 tablet (100 mg total) by mouth 2 (two) times  daily.  Hyperglycemia  Diabetes risk factors discussed, prevention thought a healthy diet and regular exercise recommended. A1C 5.7. We will continue following.  -     POCT glycosylated hemoglobin (Hb A1C)     25 min face to face OV. > 50% was dedicated to discussion of Dx, prognosis, treatment options, and some side effects of medications. Counseling about diet, calories count, healthy snacks <100 calorie and those that provide protein. She has some dietary restrictions due to lactose intolerance. Consider keeping a food diary.    -Ms. Jailene Devlin Mcveigh was advised to return sooner than planned today if new concerns arise.       Betty G. [place], MD  [facility]. [place] office.

## 2016-10-21 ENCOUNTER — Encounter: Payer: Self-pay | Admitting: Family Medicine

## 2016-10-21 ENCOUNTER — Ambulatory Visit (INDEPENDENT_AMBULATORY_CARE_PROVIDER_SITE_OTHER): Payer: 59 | Admitting: Family Medicine

## 2016-10-21 VITALS — BP 130/80 | HR 80 | Resp 12 | Ht 64.0 in | Wt 321.5 lb

## 2016-10-21 DIAGNOSIS — Z6841 Body Mass Index (BMI) 40.0 and over, adult: Secondary | ICD-10-CM | POA: Diagnosis not present

## 2016-10-21 DIAGNOSIS — F411 Generalized anxiety disorder: Secondary | ICD-10-CM

## 2016-10-21 DIAGNOSIS — R739 Hyperglycemia, unspecified: Secondary | ICD-10-CM | POA: Diagnosis not present

## 2016-10-21 LAB — POCT GLYCOSYLATED HEMOGLOBIN (HGB A1C): Hemoglobin A1C: 5.7

## 2016-10-21 MED ORDER — BUPROPION HCL ER (SR) 100 MG PO TB12: 100.0000 mg | ORAL_TABLET | Freq: Two times a day (BID) | ORAL | 1 refills | Status: DC

## 2016-10-21 NOTE — Patient Instructions (Addendum)
A few things to remember from today's visit:   BMI 50.0-59.9, adult (HCC) - Plan: buPROPion (WELLBUTRIN SR) 100 MG 12 hr tablet, POCT glycosylated hemoglobin (Hb A1C)  Anxiety state - Plan: buPROPion (WELLBUTRIN SR) 100 MG 12 hr tablet  Hyperglycemia - Plan: POCT glycosylated hemoglobin (Hb A1C) A1C: No diabetes.   What are some tips for weight loss? People become overweight for many reasons. Weight issues can run in families. They can be caused by unhealthy behaviors and a person's environment. Certain health problems and medicines can also lead to weight gain. There are some simple things you can do to reach and maintain a healthy weight:  Eat small more frequent healthy meals instead 3 bid meals. Also Weight Watchers is a good option. Avoid sweet drinks. These include regular soft drinks, fruit juices, fruit drinks, energy drinks, sweetened iced tea, and flavored milk. Avoid fast foods. Fast foods such as french fries, hamburgers, chicken nuggets, and pizza are high in calories and can cause weight gain. Eat a healthy breakfast. People who skip breakfast tend to weigh more. Don't watch more than two hours of television per day. Chew sugar-free gum between meals to cut down on snacking. Avoid grocery shopping when you're hungry. Pack a healthy lunch instead of eating out to control what and how much you eat. Eat a lot of fruits and vegetables. Aim for about 2 cups of fruit and 2 to 3 cups of vegetables per day. Aim for 150 minutes per week of moderate-intensity exercise (such as brisk walking), or 75 minutes per week of vigorous exercise (such as jogging or running). OR 15-30 min of daily brisk walking. Be more active. Small changes in physical activity can easily be added to your daily routine. For example, take the stairs instead of the elevator. Take a walk with your family. A daily walk is a great way to get exercise and to catch up on the day's events.    Please be sure medication  list is accurate. If a new problem present, please set up appointment sooner than planned today.

## 2016-10-25 ENCOUNTER — Encounter: Payer: Self-pay | Admitting: Family Medicine

## 2016-11-11 ENCOUNTER — Emergency Department (HOSPITAL_COMMUNITY)
Admission: EM | Admit: 2016-11-11 | Discharge: 2016-11-11 | Disposition: A | Discharge status: Home / Self Care | Payer: 59 | Attending: Emergency Medicine | Admitting: Emergency Medicine

## 2016-11-11 ENCOUNTER — Encounter (HOSPITAL_COMMUNITY): Payer: Self-pay | Admitting: Emergency Medicine

## 2016-11-11 DIAGNOSIS — R531 Weakness: Secondary | ICD-10-CM | POA: Insufficient documentation

## 2016-11-11 DIAGNOSIS — F909 Attention-deficit hyperactivity disorder, unspecified type: Secondary | ICD-10-CM | POA: Insufficient documentation

## 2016-11-11 DIAGNOSIS — Z79899 Other long term (current) drug therapy: Secondary | ICD-10-CM | POA: Insufficient documentation

## 2016-11-11 DIAGNOSIS — M545 Low back pain, unspecified: Secondary | ICD-10-CM

## 2016-11-11 MED ORDER — CYCLOBENZAPRINE HCL 10 MG PO TABS: 10.0000 mg | ORAL_TABLET | Freq: Three times a day (TID) | ORAL | 0 refills | Status: DC | PRN

## 2016-11-11 MED ORDER — CYCLOBENZAPRINE HCL 10 MG PO TABS
10.0000 mg | ORAL_TABLET | Freq: Once | ORAL | Status: AC
  Administered 2016-11-11: 10 mg via ORAL
  Filled 2016-11-11: qty 1

## 2016-11-11 MED ORDER — KETOROLAC TROMETHAMINE 60 MG/2ML IM SOLN
30.0000 mg | Freq: Once | INTRAMUSCULAR | Status: AC
  Administered 2016-11-11: 30 mg via INTRAMUSCULAR
  Filled 2016-11-11: qty 2

## 2016-11-11 NOTE — ED Triage Notes (Signed)
Pt. Stated, I started having back pain last night. It was sgharp. Denies any other rsymptoms

## 2016-11-11 NOTE — ED Notes (Signed)
Pt states she bent over yesterday at 5 pm  and  Then her back started to hurt rt side, states hurts to walk, denies dysuria or vag d/c

## 2016-11-11 NOTE — Discharge Instructions (Signed)
Back Pain:  You may alternate 600 mg of ibuprofen and [phone] mg of Tylenol every 3 hours as needed for pain. Do not exceed 4000 mg of Tylenol daily. Your back pain should be treated with medicines such as ibuprofen or aleve and this back pain should get better over the next 2 weeks.  However if you develop severe or worsening pain, low back pain with fever, numbness, weakness or inability to walk or urinate, you should return to the ER immediately.  Please follow up with your doctor this week for a recheck if still having symptoms. Low back pain is discomfort in the lower back that may be due to injuries to muscles and ligaments around the spine.  Occasionally, it may be caused by a a problem to a part of the spine called a disc.  The pain may last several days or a week;  However, most patients get completely well in 4 weeks.  Self - care:  The application of heat can help soothe the pain.  Maintaining your daily activities, including walking, is encourged, as it will help you get better faster than just staying in bed.  Medications are also useful to help with pain control.  A commonly prescribed medications includes acetaminophen.  This medication is generally safe, though you should not take more than 8 of the extra strength (500mg ) pills a day.  Non steroidal anti inflammatory medications including Ibuprofen and naproxen;  These medications help both pain and swelling and are very useful in treating back pain.  They should be taken with food, as they can cause stomach upset, and more seriously, stomach bleeding.    Muscle relaxants:  These medications can help with muscle tightness that is a cause of lower back pain.  Most of these medications can cause drowsiness, and it is not safe to drive or use dangerous machinery while taking them.  You will need to follow up with  Your primary healthcare provider in 1-2 weeks for reassessment.  Be aware that if you develop new symptoms, such as a fever,  leg weakness, difficulty with or loss of control of your urine or bowels, abdominal pain, or more severe pain, you will need to seek medical attention and  / or return to the Emergency department.

## 2016-11-11 NOTE — ED Provider Notes (Signed)
[facility]   CSN: [id number] Arrival date & time: 11/11/16  4098  By signing my name below, I, Thelma Barge, attest that this documentation has been prepared under the direction and in the presence of [facility], PA-C. Electronically Signed: Thelma Barge, Scribe. 11/11/16. 9:58 AM. History   Chief Complaint Chief Complaint  Patient presents with  . Back Pain   The history is provided by the patient. No language interpreter was used.   HPI Comments: Valerie Green is a 31 y.o. female who presents to the Emergency Department complaining of constant, gradually worsening right-sided back pain that began yesterday afternoon. She has associated weakness due to pain. She states she bent over to lift something and when she came up, she felt a sharp pain in her back that does not radiate anywhere. She describes this as shooting/sharp that lessens to a cramp when she sits down. The pain is improved with sitting, but worsened with walking. She has tried a hot and cold compress and taking methocarbamol with mild temporary relief. She denies fever, chills, numbness/tingling, or bladder/bowel incontinence. She further denies IVDU or falls. No PMHx of cancer.  Past Medical History:  Diagnosis Date  . ADD (attention deficit disorder)   . Allergy   . Anemia   . Anxiety state 09/07/2015  . Dysrhythmia   . Gastritis   . GERD (gastroesophageal reflux disease)   . Headache   . Pneumonia   . Pre-diabetes     Patient Active Problem List   Diagnosis Date Noted  . Obstructive sleep apnea 02/23/2016  . Insomnia 01/19/2016  . BMI 50.0-59.9, adult (HCC) 01/19/2016  . S/P laparoscopic cholecystectomy August 2017 12/23/2015  . Cholecystitis 12/22/2015  . Anxiety state 09/07/2015    Past Surgical History:  Procedure Laterality Date  . CHOLECYSTECTOMY    . LAPAROSCOPIC CHOLECYSTECTOMY SINGLE SITE WITH INTRAOPERATIVE CHOLANGIOGRAM N/A 12/22/2015   Procedure: LAPAROSCOPIC  CHOLECYSTECTOMY WITH INTRAOPERATIVE CHOLANGIOGRAM;  Surgeon: Ovidio Kin, MD;  Location: [facility];  Service: General;  Laterality: N/A;  . TOOTH EXTRACTION  2000    OB History    Gravida Para Term Preterm AB Living   1             SAB TAB Ectopic Multiple Live Births                   Home Medications    Prior to Admission medications   Medication Sig Start Date End Date Taking? Authorizing Provider  buPROPion (WELLBUTRIN SR) 100 MG 12 hr tablet Take 1 tablet (100 mg total) by mouth 2 (two) times daily. 10/21/16   [place], Betty G, MD  cyclobenzaprine (FLEXERIL) 10 MG tablet Take 1 tablet (10 mg total) by mouth 3 (three) times daily as needed for muscle spasms. 11/11/16   Shervon Kerwin A, PA-C  FLUOCINOLONE ACETONIDE BODY 0.01 % OIL Apply 1 application topically once a week. 12/01/15   [provider]  omeprazole (PRILOSEC) 20 MG capsule Take 1 capsule (20 mg total) by mouth 2 (two) times daily before a meal. Patient taking differently: Take 20 mg by mouth 2 (two) times daily as needed (reflux).  11/05/14   Blake Divine, MD    Family History Family History  Problem Relation Age of Onset  . Diabetes Mother   . Diabetes Father   . Hyperlipidemia Brother   . Heart disease Maternal Aunt   . Diabetes Paternal Uncle     Social History Social History  Substance Use  Topics  . Smoking status: Never Smoker  . Smokeless tobacco: Never Used  . Alcohol use No     Allergies   Coffee bean extract [coffea arabica]; Other; and Milk-related compounds   Review of Systems Review of Systems  Constitutional: Negative for chills and fever.  Musculoskeletal: Positive for back pain.  Neurological: Positive for weakness. Negative for numbness.     Physical Exam Updated Vital Signs BP 130/83 (BP Location: Right Arm)   Pulse 67   Temp 98.1 F (36.7 C) (Oral)   Resp 18   Ht 5\' 4"  (1.626 m)   Wt (!) 143.5 kg (316 lb 4 oz)   LMP 09/03/2016   SpO2 98%   BMI 54.28 kg/m   Physical  Exam  Constitutional: She appears well-developed and well-nourished. No distress.  HENT:  Head: Normocephalic and atraumatic.  Eyes: Conjunctivae are normal. Right eye exhibits no discharge. Left eye exhibits no discharge.  Neck: Normal range of motion. Neck supple. No JVD present. No tracheal deviation present.  Cardiovascular: Normal rate, regular rhythm, normal heart sounds and intact distal pulses.  Exam reveals no gallop and no friction rub.   No murmur heard. 2+ radial and DP/PT pulses bl, negative Homan's bl   Pulmonary/Chest: Effort normal and breath sounds normal. No respiratory distress. She has no wheezes. She has no rales.  Abdominal: Soft. Bowel sounds are normal. She exhibits no distension. There is no tenderness.  Musculoskeletal: She exhibits tenderness. She exhibits no edema or deformity.  No midline tenderness No paraspinal tenderness No crepitus or step off Pain elicited in the right paraspinal lumbar region with flexion and lateral rotation to the left 5/5 strength of BLE major muscle groups  Neurological: She is alert. No sensory deficit.  Fluent speech, no facial droop, sensation intact to soft touch of BLE, normal gait, and patient able to heel walk and toe walk without difficulty.   Skin: Skin is warm and dry. No erythema.  Psychiatric: She has a normal mood and affect. Her behavior is normal.  Nursing note and vitals reviewed.    ED Treatments / Results  DIAGNOSTIC STUDIES: Oxygen Saturation is 96% on RA, normal by my interpretation.    COORDINATION OF CARE: 9:58 AM Discussed treatment plan with pt at bedside and pt agreed to plan.  Labs (all labs ordered are listed, but only abnormal results are displayed) Labs Reviewed - No data to display  EKG  EKG Interpretation None       Radiology No results found.  Procedures Procedures (including critical care time)  Medications Ordered in ED Medications  cyclobenzaprine (FLEXERIL) tablet 10 mg (10  mg Oral Given 11/11/16 1021)  ketorolac (TORADOL) injection 30 mg (30 mg Intramuscular Given 11/11/16 1021)     Initial Impression / Assessment and Plan / ED Course  I have reviewed the triage vital signs and the nursing notes.  Pertinent labs & imaging results that were available during my care of the patient were reviewed by me and considered in my medical decision making (see chart for details).     Patient with back pain.  No neurological deficits and normal neuro exam.  Patient can walk but states is painful.  No loss of bowel or bladder control.  No concern for cauda equina.  No fever, night sweats, weight loss, h/o cancer, IVDU.  RICE protocol and pain medicine indicated and discussed with patient. We will try Flexeril as methocarbamol has not been helpful. Pain managed while in the ED with  Flexeril and Toradol. Discussed indications for return to the ED. She will follow-up with primary care for reevaluation later this week. Pt verbalized understanding of and agreement with plan and is safe for discharge home at this time.    Final Clinical Impressions(s) / ED Diagnoses   Final diagnoses:  Acute right-sided low back pain without sciatica    New Prescriptions Discharge Medication List as of 11/11/2016 10:09 AM    START taking these medications   Details  cyclobenzaprine (FLEXERIL) 10 MG tablet Take 1 tablet (10 mg total) by mouth 3 (three) times daily as needed for muscle spasms., Starting Mon 11/11/2016, Print      I personally performed the services described in this documentation, which was scribed in my presence. The recorded information has been reviewed and is accurate.     Jeanie Sewer, PA-C 11/11/16 1438    Raeford Razor, MD 11/12/16 [phone]

## 2016-12-22 NOTE — Progress Notes (Signed)
HPI:   Valerie Green is a 31 y.o. female, who is here today to follow on recent OV.   She was seen on 10/21/16. Since her last OV she has been in the ER, 11/11/16 due to lower back pain, improved. She doesn't have concerns in this regard today.   Last OV Bupropion SR 100 mg bid was started to help with wt loss and anxiety. She discontinued medication after 3 weeks because she had one episode of muscle spasm.  Anxiety is not better but reported as stable.  Anxiety is usually worse at work but in general she is able to perform and keep up with assignments. She denies depressed mood.  Obesity: Since her last OV she has made some dietary changes: Breakfast a egg and toast, stopped gummy candies, increased water intake and stopped sweet tea. Last week she ate a pack of ores in 3 days. Exercise: Not consistently.   Review of Systems  Constitutional: Positive for fatigue. Negative for activity change, appetite change, fever and unexpected weight change.  HENT: Negative for mouth sores, nosebleeds, sore throat and trouble swallowing.   Eyes: Negative for redness and visual disturbance.  Respiratory: Negative for chest tightness, shortness of breath and wheezing.   Cardiovascular: Negative for palpitations and leg swelling.  Gastrointestinal: Negative for abdominal pain, nausea and vomiting.       Negative for changes in bowel habits.  Genitourinary: Negative for decreased urine volume, dysuria and hematuria.  Musculoskeletal: Negative for gait problem.  Neurological: Negative for syncope, weakness and headaches.  Psychiatric/Behavioral: Negative for confusion. The patient is nervous/anxious.       Current Outpatient Prescriptions on File Prior to Visit  Medication Sig Dispense Refill  . cyclobenzaprine (FLEXERIL) 10 MG tablet Take 1 tablet (10 mg total) by mouth 3 (three) times daily as needed for muscle spasms. 20 tablet 0  . FLUOCINOLONE ACETONIDE BODY 0.01 % OIL  Apply 1 application topically once a week.  1   No current facility-administered medications on file prior to visit.      Past Medical History:  Diagnosis Date  . ADD (attention deficit disorder)   . Allergy   . Anemia   . Anxiety state 09/07/2015  . Dysrhythmia   . Gastritis   . GERD (gastroesophageal reflux disease)   . Headache   . Pneumonia   . Pre-diabetes    Allergies  Allergen Reactions  . Coffee Bean Extract [Coffea Arabica] Anaphylaxis  . Other Other (See Comments)    Chocolate, Garlic, spices, citrus cause gastritis  . Milk-Related Compounds Other (See Comments)    unknown    Social History   Social History  . Marital status: Single    Spouse name: N/A  . Number of children: N/A  . Years of education: N/A   Social History Main Topics  . Smoking status: Never Smoker  . Smokeless tobacco: Never Used  . Alcohol use No  . Drug use: No  . Sexual activity: Not Asked   Other Topics Concern  . None   Social History Narrative  . None    Vitals:   12/23/16 0830  BP: 124/70  Pulse: 86  Resp: 12  SpO2: 98%   Body mass index is 56 kg/m.   Wt Readings from Last 3 Encounters:  12/23/16 (!) 326 lb 4 oz (148 kg)  11/11/16 (!) 316 lb 4 oz (143.5 kg)  10/21/16 (!) 321 lb 8 oz (145.8 kg)    Physical  Exam  Nursing note and vitals reviewed. Constitutional: She is oriented to person, place, and time. She appears well-developed. No distress.  HENT:  Head: Atraumatic.  Mouth/Throat: Oropharynx is clear and moist and mucous membranes are normal.  Eyes: Pupils are equal, round, and reactive to light. Conjunctivae and EOM are normal.  Cardiovascular: Normal rate and regular rhythm.   No murmur heard. Respiratory: Effort normal and breath sounds normal. No respiratory distress.  Musculoskeletal: She exhibits no edema.       Lumbar back: She exhibits no tenderness and no bony tenderness.  Lymphadenopathy:    She has no cervical adenopathy.  Neurological: She  is alert and oriented to person, place, and time. She has normal strength. Coordination normal.  Skin: Skin is warm. No erythema.  Psychiatric: Her mood appears anxious.  Well groomed, good eye contact.     ASSESSMENT AND PLAN:    [name] was seen today for follow-up.  Diagnoses and all orders for this visit:  BMI 50.0-59.9, adult [facility])  She has gained about 5 pounds since last time I saw her (10/2016). We discussed benefits of wt loss as well as adverse effects of obesity. Pharmacologic treatments, side effects, as well as insurance coverage discussed.  She is not certain Wellbutrin caused "muscle spasm." She would like to try Wellbutrin again, we discussed side effects.  Consistency with healthy diet and physical activity recommended. If interested, Weight Watchers is a good option.  Daily brisk walking for 15-30 min as tolerated.  Anxiety state   We discussed treatment options, Wellbutrin may help. She was instructed to start Wellbutrin SR 100 mg once daily for a week or 2 and to increase to twice a day if well tolerated. We will consider a SSRI if needed.  Instructed about warning signs. She will let me know in about 4-6 weeks about medication tolerance.We may be able to increase Wellbutrin dose to 150 mg.       Saylor Murry G. [place], MD  [facility]. [place] office.

## 2016-12-23 ENCOUNTER — Encounter: Payer: Self-pay | Admitting: Family Medicine

## 2016-12-23 ENCOUNTER — Ambulatory Visit (INDEPENDENT_AMBULATORY_CARE_PROVIDER_SITE_OTHER): Payer: 59 | Admitting: Family Medicine

## 2016-12-23 VITALS — BP 124/70 | HR 86 | Resp 12 | Ht 64.0 in | Wt 326.2 lb

## 2016-12-23 DIAGNOSIS — F411 Generalized anxiety disorder: Secondary | ICD-10-CM

## 2016-12-23 DIAGNOSIS — Z6841 Body Mass Index (BMI) 40.0 and over, adult: Secondary | ICD-10-CM

## 2016-12-23 NOTE — Patient Instructions (Addendum)
A few things to remember from today's visit:   BMI 50.0-59.9, adult (HCC)  Anxiety state  In regard to Wellbutrin, resume medication but once daily for 1-2 weeks and then increase it to 2 tabs daily if well tolerated.    Please be sure medication list is accurate. If a new problem present, please set up appointment sooner than planned today.

## 2016-12-24 ENCOUNTER — Ambulatory Visit (HOSPITAL_COMMUNITY)
Admission: EM | Admit: 2016-12-24 | Discharge: 2016-12-24 | Disposition: A | Discharge status: Home / Self Care | Payer: 59 | Attending: Family Medicine | Admitting: Family Medicine

## 2016-12-24 ENCOUNTER — Encounter (HOSPITAL_COMMUNITY): Payer: Self-pay | Admitting: Emergency Medicine

## 2016-12-24 ENCOUNTER — Other Ambulatory Visit: Payer: Self-pay | Admitting: Family Medicine

## 2016-12-24 DIAGNOSIS — Z6841 Body Mass Index (BMI) 40.0 and over, adult: Secondary | ICD-10-CM

## 2016-12-24 DIAGNOSIS — H1032 Unspecified acute conjunctivitis, left eye: Secondary | ICD-10-CM | POA: Diagnosis not present

## 2016-12-24 DIAGNOSIS — F411 Generalized anxiety disorder: Secondary | ICD-10-CM

## 2016-12-24 MED ORDER — POLYETHYL GLYCOL-PROPYL GLYCOL 0.4-0.3 % OP GEL: 1.0000 "application " | Freq: Every evening | OPHTHALMIC | 0 refills | Status: DC | PRN

## 2016-12-24 MED ORDER — POLYETHYL GLYCOL-PROPYL GLYCOL 0.4-0.3 % OP SOLN: 1.0000 [drp] | Freq: Four times a day (QID) | OPHTHALMIC | 0 refills | Status: DC | PRN

## 2016-12-24 MED ORDER — OFLOXACIN 0.3 % OP SOLN: 1.0000 [drp] | Freq: Four times a day (QID) | OPHTHALMIC | 0 refills | Status: AC

## 2016-12-24 NOTE — ED Triage Notes (Signed)
Left eye redness.  Patient says this is related to contacts.  No change in vision per patient

## 2016-12-24 NOTE — Discharge Instructions (Signed)
Start ofloxacin eyedrops as directed. Systane eyedrops during the day, Systane gel at night. Allow 10-15 minutes between drops. Refrain from contact use for the next 2 weeks. Use baby shampoo or lid scrub to clean lids. Warm compresses on the eye. Monitor for any worsening of symptoms, decrease in vision, sensitivity to light, increased pain, nausea, vomiting, headache, follow-up with the ophthalmologist for further evaluation.

## 2016-12-24 NOTE — ED Notes (Signed)
Unable to obtain visual acuity due to pt only having contact in good eye.

## 2016-12-24 NOTE — ED Provider Notes (Signed)
[facility]    CSN: [id number] Arrival date & time: 12/24/16  1049     History   Chief Complaint Chief Complaint  Patient presents with  . Eye Problem    HPI Valerie Green is a 31 y.o. female.   31 year old female comes in for 1 day history of left eye redness and foreign body sensation. She is a contact lens wearer, usually wear biweekly's, current contacts 2 day old, denies sleeping with contacts in. She denies any changes in vision, abundant discharge, photophobia. Denies trauma to the eye. She has had some headache due to the eye irritation. Symptoms first started last night, she woke up without crusting of the eye, putting contacts and went to work. Contacts made symptoms worse, with worsening irritation and pain. Removed contact in office. Denies URI symptoms such as fever, chills, night sweats, nasal congestion, cough, sore throat.      Past Medical History:  Diagnosis Date  . ADD (attention deficit disorder)   . Allergy   . Anemia   . Anxiety state 09/07/2015  . Dysrhythmia   . Gastritis   . GERD (gastroesophageal reflux disease)   . Headache   . Pneumonia   . Pre-diabetes     Patient Active Problem List   Diagnosis Date Noted  . Obstructive sleep apnea 02/23/2016  . Insomnia 01/19/2016  . BMI 50.0-59.9, adult (HCC) 01/19/2016  . S/P laparoscopic cholecystectomy August 2017 12/23/2015  . Cholecystitis 12/22/2015  . Anxiety state 09/07/2015    Past Surgical History:  Procedure Laterality Date  . CHOLECYSTECTOMY    . LAPAROSCOPIC CHOLECYSTECTOMY SINGLE SITE WITH INTRAOPERATIVE CHOLANGIOGRAM N/A 12/22/2015   Procedure: LAPAROSCOPIC CHOLECYSTECTOMY WITH INTRAOPERATIVE CHOLANGIOGRAM;  Surgeon: Ovidio Kin, MD;  Location: [facility] OR;  Service: General;  Laterality: N/A;  . TOOTH EXTRACTION  2000    OB History    Gravida Para Term Preterm AB Living   1             SAB TAB Ectopic Multiple Live Births                   Home Medications      Prior to Admission medications   Medication Sig Start Date End Date Taking? Authorizing Provider  buPROPion [facility] SR) 100 MG 12 hr tablet TAKE 1 TABLET BY MOUTH TWICE A DAY 12/24/16   [place], Betty G, MD  cyclobenzaprine (FLEXERIL) 10 MG tablet Take 1 tablet (10 mg total) by mouth 3 (three) times daily as needed for muscle spasms. 11/11/16   Fawze, Mina A, PA-C  FLUOCINOLONE ACETONIDE BODY 0.01 % OIL Apply 1 application topically once a week. 12/01/15   [provider]  ofloxacin (OCUFLOX) 0.3 % ophthalmic solution Place 1 drop into the left eye 4 (four) times daily. 12/24/16 12/31/16  Belinda Fisher, PA-C  Polyethyl Glycol-Propyl Glycol (SYSTANE) 0.4-0.3 % GEL ophthalmic gel Place 1 application into both eyes at bedtime as needed. 12/24/16   Cathie Hoops, Yudith Norlander V, PA-C  Polyethyl Glycol-Propyl Glycol (SYSTANE) 0.4-0.3 % SOLN Apply 1-2 drops to eye 4 (four) times daily as needed. 12/24/16   Belinda Fisher, PA-C    Family History Family History  Problem Relation Age of Onset  . Diabetes Mother   . Diabetes Father   . Hyperlipidemia Brother   . Heart disease Maternal Aunt   . Diabetes Paternal Uncle     Social History Social History  Substance Use Topics  . Smoking status: Never Smoker  . Smokeless  tobacco: Never Used  . Alcohol use No     Allergies   Coffee bean extract [coffea arabica]; Other; and Milk-related compounds   Review of Systems Review of Systems  Reason unable to perform ROS: See HPI as above.     Physical Exam Triage Vital Signs ED Triage Vitals  Enc Vitals Group     BP 12/24/16 1141 112/75     Pulse Rate 12/24/16 1141 69     Resp 12/24/16 1141 18     Temp 12/24/16 1141 98.4 F (36.9 C)     Temp Source 12/24/16 1141 Oral     SpO2 12/24/16 1141 100 %     Weight --      Height --      Head Circumference --      Peak Flow --      Pain Score 12/24/16 1140 7     Pain Loc --      Pain Edu? --      Excl. in GC? --    No data found.   Updated Vital Signs BP  112/75 (BP Location: Left Arm)   Pulse 69   Temp 98.4 F (36.9 C) (Oral)   Resp 18   SpO2 100%   Breastfeeding? Unknown    Physical Exam  Constitutional: She is oriented to person, place, and time. She appears well-developed and well-nourished. No distress.  Eyes: Pupils are equal, round, and reactive to light. EOM and lids are normal. Lids are everted and swept, no foreign bodies found. Right eye exhibits no discharge and no hordeolum. No foreign body present in the right eye. Left eye exhibits no discharge and no hordeolum. No foreign body present in the left eye. Right conjunctiva is not injected. Right conjunctiva has no hemorrhage. Left conjunctiva is injected. Left conjunctiva has no hemorrhage.  Fluorescein stain revealed no increased uptake. Tetracaine eased irritation and headache.  Neurological: She is alert and oriented to person, place, and time.     UC Treatments / Results  Labs (all labs ordered are listed, but only abnormal results are displayed) Labs Reviewed - No data to display  EKG  EKG Interpretation None       Radiology No results found.  Procedures Procedures (including critical care time)  Medications Ordered in UC Medications - No data to display   Initial Impression / Assessment and Plan / UC Course  I have reviewed the triage vital signs and the nursing notes.  Pertinent labs & imaging results that were available during my care of the patient were reviewed by me and considered in my medical decision making (see chart for details).    Start ofloxacin as directed. Artificial tears for dry eyes, instructions for blepharitis treatment given for dry eyes. Patient to refrain from contact lens use for the next 2 weeks. Return precautions given. Resources for ophthalmology given.  Final Clinical Impressions(s) / UC Diagnoses   Final diagnoses:  Acute bacterial conjunctivitis of left eye    New Prescriptions Discharge Medication List as of  12/24/2016 12:27 PM    START taking these medications   Details  ofloxacin (OCUFLOX) 0.3 % ophthalmic solution Place 1 drop into the left eye 4 (four) times daily., Starting Tue 12/24/2016, Until Tue 12/31/2016, Normal    Polyethyl Glycol-Propyl Glycol (SYSTANE) 0.4-0.3 % GEL ophthalmic gel Place 1 application into both eyes at bedtime as needed., Starting Tue 12/24/2016, Normal    Polyethyl Glycol-Propyl Glycol (SYSTANE) 0.4-0.3 % SOLN Apply 1-2 drops to eye  4 (four) times daily as needed., Starting Tue 12/24/2016, Normal          Belinda Fisher, PA-C 12/24/16 1241

## 2017-01-02 ENCOUNTER — Encounter: Payer: Self-pay | Admitting: Family Medicine

## 2017-01-23 ENCOUNTER — Encounter: Payer: Self-pay | Admitting: Family Medicine

## 2017-03-24 NOTE — Progress Notes (Deleted)
HPI:   Valerie Green is a 31 y.o. female, who is here today to follow on some chronic medical problems.  She was last seen on 12/23/16.   Anxiety:  She is currently on Wellbutrin SR 100 mg bid. *** She denies suicidal thoughts.  Obesity:  Dietary changes since last OV: *** Exercise: ***   Concerns today: ***     Review of Systems    Current Outpatient Medications on File Prior to Visit  Medication Sig Dispense Refill  . buPROPion (WELLBUTRIN SR) 100 MG 12 hr tablet TAKE 1 TABLET BY MOUTH TWICE A DAY 60 tablet 3  . cyclobenzaprine (FLEXERIL) 10 MG tablet Take 1 tablet (10 mg total) by mouth 3 (three) times daily as needed for muscle spasms. 20 tablet 0  . FLUOCINOLONE ACETONIDE BODY 0.01 % OIL Apply 1 application topically once a week.  1  . Polyethyl Glycol-Propyl Glycol (SYSTANE) 0.4-0.3 % GEL ophthalmic gel Place 1 application into both eyes at bedtime as needed. 1 Bottle 0  . Polyethyl Glycol-Propyl Glycol (SYSTANE) 0.4-0.3 % SOLN Apply 1-2 drops to eye 4 (four) times daily as needed. 1 Bottle 0   No current facility-administered medications on file prior to visit.      Past Medical History:  Diagnosis Date  . ADD (attention deficit disorder)   . Allergy   . Anemia   . Anxiety state 09/07/2015  . Dysrhythmia   . Gastritis   . GERD (gastroesophageal reflux disease)   . Headache   . Pneumonia   . Pre-diabetes    Allergies  Allergen Reactions  . Coffee Bean Extract [Coffea Arabica] Anaphylaxis  . Other Other (See Comments)    Chocolate, Garlic, spices, citrus cause gastritis  . Milk-Related Compounds Other (See Comments)    unknown    Social History   Socioeconomic History  . Marital status: Single    Spouse name: Not on file  . Number of children: Not on file  . Years of education: Not on file  . Highest education level: Not on file  Social Needs  . Financial resource strain: Not on file  . Food insecurity - worry: Not on  file  . Food insecurity - inability: Not on file  . Transportation needs - medical: Not on file  . Transportation needs - non-medical: Not on file  Occupational History  . Not on file  Tobacco Use  . Smoking status: Never Smoker  . Smokeless tobacco: Never Used  Substance and Sexual Activity  . Alcohol use: No    Alcohol/week: 0.0 oz  . Drug use: No  . Sexual activity: Not on file  Other Topics Concern  . Not on file  Social History Narrative  . Not on file    There were no vitals filed for this visit. There is no height or weight on file to calculate BMI.   Wt Readings from Last 3 Encounters:  12/23/16 (!) 326 lb 4 oz (148 kg)  11/11/16 (!) 316 lb 4 oz (143.5 kg)  10/21/16 (!) 321 lb 8 oz (145.8 kg)      Physical Exam    ASSESSMENT AND PLAN:     There are no diagnoses linked to this encounter.         -Valerie Green was advised to return sooner than planned today if new concerns arise.       Ladarion Munyon G. [place], MD  [facility]. [place] office.

## 2017-03-25 ENCOUNTER — Ambulatory Visit: Payer: 59 | Admitting: Family Medicine

## 2017-03-25 ENCOUNTER — Ambulatory Visit (INDEPENDENT_AMBULATORY_CARE_PROVIDER_SITE_OTHER): Payer: 59 | Admitting: Family Medicine

## 2017-03-25 ENCOUNTER — Encounter: Payer: Self-pay | Admitting: Family Medicine

## 2017-03-25 VITALS — BP 118/68 | HR 63 | Temp 98.1°F | Resp 12 | Ht 64.0 in | Wt 321.0 lb

## 2017-03-25 DIAGNOSIS — Z23 Encounter for immunization: Secondary | ICD-10-CM | POA: Diagnosis not present

## 2017-03-25 DIAGNOSIS — Z6841 Body Mass Index (BMI) 40.0 and over, adult: Secondary | ICD-10-CM | POA: Diagnosis not present

## 2017-03-25 DIAGNOSIS — F419 Anxiety disorder, unspecified: Secondary | ICD-10-CM | POA: Diagnosis not present

## 2017-03-25 MED ORDER — PHENTERMINE HCL 37.5 MG PO TABS: 37.5000 mg | ORAL_TABLET | Freq: Every day | ORAL | 1 refills | Status: DC

## 2017-03-25 NOTE — Progress Notes (Signed)
HPI:   Valerie Green is a 31 y.o. female, who is here today to follow on some chronic medical problems.  She was last seen on 12/23/16.   Anxiety:  She is currently on Wellbutrin SR 100 mg bid, discontinued due to muscles spasm, which happened again when she resumed medication.  She felt like Wellbutrin helped with motivation, took it for 3-4 weeks.  She denies suicidal thoughts. Hx of ADD She is also attending school, she does not like her classes,so sometimes she does not feel motivated to finish. She does not think ADD is an issue at this time.  She was on ADD treatment during childhood but most of medications exacerbated migraines.   Obesity:  Dietary changes since last OV: Cooking at home and drinking more water since her last visit. Exercise: Has not done so since she stopped Wellbutrin, not motivated to do so.   She is still eating fast food but mainly salads.    Review of Systems  Constitutional: Positive for fatigue (no more than usual). Negative for activity change, appetite change and fever.  HENT: Negative for mouth sores, nosebleeds, sore throat and trouble swallowing.   Eyes: Negative for redness and visual disturbance.  Respiratory: Negative for cough, shortness of breath and wheezing.   Cardiovascular: Negative for chest pain, palpitations and leg swelling.  Gastrointestinal: Negative for abdominal pain, nausea and vomiting.       Negative for changes in bowel habits.  Endocrine: Negative for cold intolerance and heat intolerance.  Genitourinary: Negative for decreased urine volume and hematuria.  Musculoskeletal: Negative for arthralgias, gait problem and myalgias.  Neurological: Negative for syncope, weakness and headaches.  Psychiatric/Behavioral: Negative for confusion. The patient is not nervous/anxious.       Current Outpatient Medications on File Prior to Visit  Medication Sig Dispense Refill  . Ciclopirox 1 % shampoo       . buPROPion (WELLBUTRIN SR) 100 MG 12 hr tablet TAKE 1 TABLET BY MOUTH TWICE A DAY 60 tablet 3  . cyclobenzaprine (FLEXERIL) 10 MG tablet Take 1 tablet (10 mg total) by mouth 3 (three) times daily as needed for muscle spasms. 20 tablet 0  . FLUOCINOLONE ACETONIDE BODY 0.01 % OIL Apply 1 application topically once a week.  1   No current facility-administered medications on file prior to visit.      Past Medical History:  Diagnosis Date  . ADD (attention deficit disorder)   . Allergy   . Anemia   . Anxiety state 09/07/2015  . Dysrhythmia   . Gastritis   . GERD (gastroesophageal reflux disease)   . Headache   . Pneumonia   . Pre-diabetes    Allergies  Allergen Reactions  . Coffee Bean Extract [Coffea Arabica] Anaphylaxis  . Other Other (See Comments)    Chocolate, Garlic, spices, citrus cause gastritis  . Milk-Related Compounds Other (See Comments)    unknown    Social History   Socioeconomic History  . Marital status: Single    Spouse name: None  . Number of children: None  . Years of education: None  . Highest education level: None  Social Needs  . Financial resource strain: None  . Food insecurity - worry: None  . Food insecurity - inability: None  . Transportation needs - medical: None  . Transportation needs - non-medical: None  Occupational History  . None  Tobacco Use  . Smoking status: Never Smoker  . Smokeless tobacco: Never Used  Substance and Sexual Activity  . Alcohol use: No    Alcohol/week: 0.0 oz  . Drug use: No  . Sexual activity: None  Other Topics Concern  . None  Social History Narrative  . None    Vitals:   03/25/17 0900  BP: 118/68  Pulse: 63  Resp: 12  Temp: 98.1 F (36.7 C)  SpO2: 98%   Body mass index is 55.1 kg/m.   Wt Readings from Last 3 Encounters:  03/25/17 (!) 321 lb (145.6 kg)  12/23/16 (!) 326 lb 4 oz (148 kg)  11/11/16 (!) 316 lb 4 oz (143.5 kg)     Physical Exam  Nursing note and vitals  reviewed. Constitutional: She is oriented to person, place, and time. She appears well-developed. No distress.  HENT:  Head: Normocephalic and atraumatic.  Mouth/Throat: Oropharynx is clear and moist and mucous membranes are normal.  Eyes: Conjunctivae and EOM are normal. Pupils are equal, round, and reactive to light.  Neck: No tracheal deviation present. No thyroid mass and no thyromegaly present.  Cardiovascular: Normal rate and regular rhythm.  No murmur heard. Respiratory: Effort normal and breath sounds normal. No respiratory distress.  GI: Soft. She exhibits no mass. There is no hepatomegaly. There is no tenderness.  Musculoskeletal: She exhibits edema (Trace pitting LE edema,bilateral.). She exhibits no tenderness.  Lymphadenopathy:    She has no cervical adenopathy.  Neurological: She is alert and oriented to person, place, and time. She has normal strength. Coordination normal.  Skin: Skin is warm. No erythema.  Psychiatric: She has a normal mood and affect.  Well groomed, good eye contact.      ASSESSMENT AND PLAN:   Ms. Barrie LymeRashada was seen today for follow-up.  Diagnoses and all orders for this visit:  BMI 50.0-59.9, adult [facility](HCC)  She lost about 4-5 Lb since her last visit , 12/2016. We discussed benefits of wt loss as well as adverse effects of obesity. Consistency with healthy diet and physical activity recommended. Pharmacologic treatments discussed as well as side effects, she would like to try Phentermine. Recommend starting 1/2 tab x 2 weeks then increase dose to 1 tab if well tolerated. LMP 12/2016,Hx of irregular menses,denies sexual activity ever. Brisk walking for 15-30 min as tolerated. F/U in 5 weeks.  -     phentermine (ADIPEX-P) 37.5 MG tablet; Take 1 tablet (37.5 mg total) daily before breakfast by mouth.  Anxiety disorder, unspecified type  Overall stable. She does not feel like it is bad enough to take mediation. She does not think she needs  treatment for ADD.  Need for influenza vaccination -     Flu Vaccine QUAD 36+ mos IM      -Ms. Azhia Kennith GainChantal Heiner was advised to return sooner than planned today if new concerns arise.       Betty G. [place], MD  [facility]. [place] office.

## 2017-03-25 NOTE — Patient Instructions (Addendum)
A few things to remember from today's visit:   BMI 50.0-59.9, adult (HCC) - Plan: phentermine (ADIPEX-P) 37.5 MG tablet  Start 1/2 tab for 2 weeks then increased to whole tab.    Calorie Counting for Weight Loss Calories are units of energy. Your body needs a certain amount of calories from food to keep you going throughout the day. When you eat more calories than your body needs, your body stores the extra calories as fat. When you eat fewer calories than your body needs, your body burns fat to get the energy it needs. Calorie counting means keeping track of how many calories you eat and drink each day. Calorie counting can be helpful if you need to lose weight. If you make sure to eat fewer calories than your body needs, you should lose weight. Ask your health care provider what a healthy weight is for you. For calorie counting to work, you will need to eat the right number of calories in a day in order to lose a healthy amount of weight per week. A dietitian can help you determine how many calories you need in a day and will give you suggestions on how to reach your calorie goal.  A healthy amount of weight to lose per week is usually 1-2 lb (0.5-0.9 kg). This usually means that your daily calorie intake should be reduced by 500-750 calories.  Eating 1,200 - 1,500 calories per day can help most women lose weight.  Eating 1,500 - 1,800 calories per day can help most men lose weight.  What is my plan? My goal is to have __________ calories per day. If I have this many calories per day, I should lose around __________ pounds per week. What do I need to know about calorie counting? In order to meet your daily calorie goal, you will need to:  Find out how many calories are in each food you would like to eat. Try to do this before you eat.  Decide how much of the food you plan to eat.  Write down what you ate and how many calories it had. Doing this is called keeping a food log.  To  successfully lose weight, it is important to balance calorie counting with a healthy lifestyle that includes regular activity. Aim for 150 minutes of moderate exercise (such as walking) or 75 minutes of vigorous exercise (such as running) each week. Where do I find calorie information?  The number of calories in a food can be found on a Nutrition Facts label. If a food does not have a Nutrition Facts label, try to look up the calories online or ask your dietitian for help. Remember that calories are listed per serving. If you choose to have more than one serving of a food, you will have to multiply the calories per serving by the amount of servings you plan to eat. For example, the label on a package of bread might say that a serving size is 1 slice and that there are 90 calories in a serving. If you eat 1 slice, you will have eaten 90 calories. If you eat 2 slices, you will have eaten 180 calories. How do I keep a food log? Immediately after each meal, record the following information in your food log:  What you ate. Don't forget to include toppings, sauces, and other extras on the food.  How much you ate. This can be measured in cups, ounces, or number of items.  How many calories each food  and drink had.  The total number of calories in the meal.  Keep your food log near you, such as in a small notebook in your pocket, or use a mobile app or website. Some programs will calculate calories for you and show you how many calories you have left for the day to meet your goal. What are some calorie counting tips?  Use your calories on foods and drinks that will fill you up and not leave you hungry: ? Some examples of foods that fill you up are nuts and nut butters, vegetables, lean proteins, and high-fiber foods like whole grains. High-fiber foods are foods with more than 5 g fiber per serving. ? Drinks such as sodas, specialty coffee drinks, alcohol, and juices have a lot of calories, yet do not  fill you up.  Eat nutritious foods and avoid empty calories. Empty calories are calories you get from foods or beverages that do not have many vitamins or protein, such as candy, sweets, and soda. It is better to have a nutritious high-calorie food (such as an avocado) than a food with few nutrients (such as a bag of chips).  Know how many calories are in the foods you eat most often. This will help you calculate calorie counts faster.  Pay attention to calories in drinks. Low-calorie drinks include water and unsweetened drinks.  Pay attention to nutrition labels for "low fat" or "fat free" foods. These foods sometimes have the same amount of calories or more calories than the full fat versions. They also often have added sugar, starch, or salt, to make up for flavor that was removed with the fat.  Find a way of tracking calories that works for you. Get creative. Try different apps or programs if writing down calories does not work for you. What are some portion control tips?  Know how many calories are in a serving. This will help you know how many servings of a certain food you can have.  Use a measuring cup to measure serving sizes. You could also try weighing out portions on a kitchen scale. With time, you will be able to estimate serving sizes for some foods.  Take some time to put servings of different foods on your favorite plates, bowls, and cups so you know what a serving looks like.  Try not to eat straight from a bag or box. Doing this can lead to overeating. Put the amount you would like to eat in a cup or on a plate to make sure you are eating the right portion.  Use smaller plates, glasses, and bowls to prevent overeating.  Try not to multitask (for example, watch TV or use your computer) while eating. If it is time to eat, sit down at a table and enjoy your food. This will help you to know when you are full. It will also help you to be aware of what you are eating and how much  you are eating. What are tips for following this plan? Reading food labels  Check the calorie count compared to the serving size. The serving size may be smaller than what you are used to eating.  Check the source of the calories. Make sure the food you are eating is high in vitamins and protein and low in saturated and trans fats. Shopping  Read nutrition labels while you shop. This will help you make healthy decisions before you decide to purchase your food.  Make a grocery list and stick to it. Cooking  Try to cook your favorite foods in a healthier way. For example, try baking instead of frying.  Use low-fat dairy products. Meal planning  Use more fruits and vegetables. Half of your plate should be fruits and vegetables.  Include lean proteins like poultry and fish. How do I count calories when eating out?  Ask for smaller portion sizes.  Consider sharing an entree and sides instead of getting your own entree.  If you get your own entree, eat only half. Ask for a box at the beginning of your meal and put the rest of your entree in it so you are not tempted to eat it.  If calories are listed on the menu, choose the lower calorie options.  Choose dishes that include vegetables, fruits, whole grains, low-fat dairy products, and lean protein.  Choose items that are boiled, broiled, grilled, or steamed. Stay away from items that are buttered, battered, fried, or served with cream sauce. Items labeled "crispy" are usually fried, unless stated otherwise.  Choose water, low-fat milk, unsweetened iced tea, or other drinks without added sugar. If you want an alcoholic beverage, choose a lower calorie option such as a glass of wine or light beer.  Ask for dressings, sauces, and syrups on the side. These are usually high in calories, so you should limit the amount you eat.  If you want a salad, choose a garden salad and ask for grilled meats. Avoid extra toppings like bacon, cheese,  or fried items. Ask for the dressing on the side, or ask for olive oil and vinegar or lemon to use as dressing.  Estimate how many servings of a food you are given. For example, a serving of cooked rice is  cup or about the size of half a baseball. Knowing serving sizes will help you be aware of how much food you are eating at restaurants. The list below tells you how big or small some common portion sizes are based on everyday objects: ? 1 oz-4 stacked dice. ? 3 oz-1 deck of cards. ? 1 tsp-1 die. ? 1 Tbsp- a ping-pong ball. ? 2 Tbsp-1 ping-pong ball. ?  cup- baseball. ? 1 cup-1 baseball. Summary  Calorie counting means keeping track of how many calories you eat and drink each day. If you eat fewer calories than your body needs, you should lose weight.  A healthy amount of weight to lose per week is usually 1-2 lb (0.5-0.9 kg). This usually means reducing your daily calorie intake by 500-750 calories.  The number of calories in a food can be found on a Nutrition Facts label. If a food does not have a Nutrition Facts label, try to look up the calories online or ask your dietitian for help.  Use your calories on foods and drinks that will fill you up, and not on foods and drinks that will leave you hungry.  Use smaller plates, glasses, and bowls to prevent overeating. This information is not intended to replace advice given to you by your health care provider. Make sure you discuss any questions you have with your health care provider. Document Released: 04/22/2005 Document Revised: 03/22/2016 Document Reviewed: 03/22/2016 Elsevier Interactive Patient Education  2017 [employer]  Please be sure medication list is accurate. If a new problem present, please set up appointment sooner than planned today.

## 2017-04-21 ENCOUNTER — Encounter: Payer: Self-pay | Admitting: Family Medicine

## 2017-05-08 NOTE — Progress Notes (Signed)
HPI:   Valerie Green is a 32 y.o. female, who is here today to follow on recent OV.   She was seen on 03/25/17.  She is on Phentermine for wt loss, medication was started last OV. Overall she has tolerated medication well, denies side effects.  She states that she has not pay attention to wt loss but friends have noted it. She is under some stress, her friend recently had heart surgery and she is taking care of her.  She has noted decreased appetite,so she is not eating big portions,still eating fast food. Sometimes she skips meals.  She is not exercising regularly.  She called a few days ago requesting forms completed for work,so she can be allowed to have extra breaks due to "abdominal issues." S/P cholecystectomy, since surgery she has had increase in stooling. She states that in general stool is "normal",sometimes softer, no blood or mucus.  Lower abdomen cramps,sudden onset,and exacerbated by food intake: Mainly greasy food,eggs,and read meat among some. In general every time she eats she has some abdominal discomfort.  She denies fever,chills. If she does not eat she does not have symptoms. Abdominal pain improves and eventually resolves after defecation.  Depression and anxiety: Chronic intermittent,she has denied symptoms but since her last OV her friends have noted more irritability and depressed mood. She denies suicidal thoughts. She has not noted major symptoms. Denies insomnia, sleeping about 5 hours but mainly because her friends's health issues and recent surgery. She has tried Wellbutrin before but caused lower back pain.  She has not tried medications in the past.   Review of Systems  Constitutional: Negative for activity change, appetite change and fatigue.  HENT: Negative for mouth sores and sore throat.   Respiratory: Negative for chest tightness, shortness of breath and wheezing.   Cardiovascular: Negative for chest pain, palpitations  and leg swelling.  Gastrointestinal: Positive for abdominal pain. Negative for blood in stool, nausea and vomiting.  Endocrine: Negative for cold intolerance, heat intolerance, polydipsia, polyphagia and polyuria.  Genitourinary: Negative for decreased urine volume and hematuria.  Musculoskeletal: Negative for gait problem and myalgias.  Skin: Negative for rash.  Neurological: Negative for tremors, syncope, weakness and headaches.  Psychiatric/Behavioral: Negative for confusion, hallucinations, sleep disturbance and suicidal ideas. The patient is nervous/anxious.       Current Outpatient Medications on File Prior to Visit  Medication Sig Dispense Refill  . buPROPion (WELLBUTRIN SR) 100 MG 12 hr tablet TAKE 1 TABLET BY MOUTH TWICE A DAY 60 tablet 3  . Ciclopirox 1 % shampoo     . cyclobenzaprine (FLEXERIL) 10 MG tablet Take 1 tablet (10 mg total) by mouth 3 (three) times daily as needed for muscle spasms. 20 tablet 0  . FLUOCINOLONE ACETONIDE BODY 0.01 % OIL Apply 1 application topically once a week.  1  . phentermine (ADIPEX-P) 37.5 MG tablet Take 1 tablet (37.5 mg total) daily before breakfast by mouth. 30 tablet 1   No current facility-administered medications on file prior to visit.      Past Medical History:  Diagnosis Date  . ADD (attention deficit disorder)   . Allergy   . Anemia   . Anxiety state 09/07/2015  . Dysrhythmia   . Gastritis   . GERD (gastroesophageal reflux disease)   . Headache   . Pneumonia   . Pre-diabetes    Allergies  Allergen Reactions  . Coffee Bean Extract [Coffea Arabica] Anaphylaxis  . Other Other (See Comments)  Chocolate, Garlic, spices, citrus cause gastritis  . Milk-Related Compounds Other (See Comments)    unknown    Social History   Socioeconomic History  . Marital status: Single    Spouse name: None  . Number of children: None  . Years of education: None  . Highest education level: None  Social Needs  . Financial resource  strain: None  . Food insecurity - worry: None  . Food insecurity - inability: None  . Transportation needs - medical: None  . Transportation needs - non-medical: None  Occupational History  . None  Tobacco Use  . Smoking status: Never Smoker  . Smokeless tobacco: Never Used  Substance and Sexual Activity  . Alcohol use: No    Alcohol/week: 0.0 oz  . Drug use: No  . Sexual activity: None  Other Topics Concern  . None  Social History Narrative  . None    Vitals:   05/09/17 0901  BP: 112/70  Pulse: 70  Resp: 12  Temp: 98.1 F (36.7 C)  SpO2: 99%   Body mass index is 53.94 kg/m.   Wt Readings from Last 3 Encounters:  05/09/17 (!) 314 lb 4 oz (142.5 kg)  03/25/17 (!) 321 lb (145.6 kg)  12/23/16 (!) 326 lb 4 oz (148 kg)     Physical Exam  Nursing note and vitals reviewed. Constitutional: She is oriented to person, place, and time. She appears well-developed. No distress.  HENT:  Head: Normocephalic and atraumatic.  Mouth/Throat: Oropharynx is clear and moist and mucous membranes are normal.  Eyes: Conjunctivae are normal. Pupils are equal, round, and reactive to light.  Cardiovascular: Normal rate and regular rhythm.  No murmur heard. Pulses:      Dorsalis pedis pulses are 2+ on the right side, and 2+ on the left side.  Respiratory: Effort normal and breath sounds normal. No respiratory distress.  GI: Soft. Bowel sounds are normal. She exhibits no mass. There is no hepatomegaly. There is no tenderness.  Musculoskeletal: She exhibits no edema or tenderness.  Lymphadenopathy:    She has no cervical adenopathy.  Neurological: She is alert and oriented to person, place, and time. She has normal strength. Gait normal.  Skin: Skin is warm. No rash noted. No erythema.  Psychiatric: Her mood appears anxious.  Well groomed, good eye contact.    ASSESSMENT AND PLAN:   Valerie Green was seen today for follow-up.  Diagnoses and all orders for this visit:   BMI  50.0-59.9, adult (HCC)  She has lost about 7 Lb since her last OV. We discussed benefits of wt loss as well as adverse effects of obesity. Consistency with healthy diet and physical activity recommended. We reviewed some side effects of Phentermine,she will complete 12 weeks and then we will start weaning off. We discussed a few options in regard to diets, she could try fasting a couple of days per week (eating once daily) and rest of days eating 2 meals. Brisk walking for 15-30 min as tolerated. F/U in 6 weeks.  Lower abdominal pain  ? Post cholecystectomy synd, ? IBS among some to consider. Avoid food she has identified as trigger factors. She will skip lunch a couple times per week. Form will be completed, 2 extra breaks to be allowed per day :10-15 min each one). Instructed about warning signs. F/U in 6 weeks.  Anxiety disorder, unspecified type  + Mild depression. Today after discussion of a few treatment options,she agrees with Sertraline 25 mg daily. Instructed about warning  signs. F/U in 6 weeks.  -     sertraline (ZOLOFT) 25 MG tablet; Take 1 tablet (25 mg total) by mouth daily.      Rhaelyn Giron G. [place], MD  [facility]. [place] office.

## 2017-05-09 ENCOUNTER — Encounter: Payer: Self-pay | Admitting: Family Medicine

## 2017-05-09 ENCOUNTER — Ambulatory Visit (INDEPENDENT_AMBULATORY_CARE_PROVIDER_SITE_OTHER): Payer: 59 | Admitting: Family Medicine

## 2017-05-09 VITALS — BP 112/70 | HR 70 | Temp 98.1°F | Resp 12 | Ht 64.0 in | Wt 314.2 lb

## 2017-05-09 DIAGNOSIS — F419 Anxiety disorder, unspecified: Secondary | ICD-10-CM | POA: Diagnosis not present

## 2017-05-09 DIAGNOSIS — R1030 Lower abdominal pain, unspecified: Secondary | ICD-10-CM

## 2017-05-09 DIAGNOSIS — Z6841 Body Mass Index (BMI) 40.0 and over, adult: Secondary | ICD-10-CM | POA: Diagnosis not present

## 2017-05-09 MED ORDER — SERTRALINE HCL 25 MG PO TABS: 25.0000 mg | ORAL_TABLET | Freq: Every day | ORAL | 1 refills | Status: DC

## 2017-05-09 NOTE — Patient Instructions (Addendum)
A few things to remember from today's visit:   Anxiety disorder, unspecified type - Plan: sertraline (ZOLOFT) 25 MG tablet  BMI 50.0-59.9, adult (HCC)  Lower abdominal pain  Avoid foods that cause gastrointestinal problems. We will fill the brake recommendation to allow you go to the bathroom once or twice per day. Try fasting about 3 times per week, this may help with weight loss and GI symptoms.   Today we started Sertraline, this type of medications can increase suicidal risk. This is more prevalent among children,adolecents, and young adults with major depression or other psychiatric disorders. It can also make depression worse. Most common side effects are gastrointestinal, self limited after a few weeks: diarrhea, nausea, constipation  Or diarrhea among some.  In general it is well tolerated. We will follow closely.    You have refill for Phentermine.   Please be sure medication list is accurate. If a new problem present, please set up appointment sooner than planned today.

## 2017-05-13 ENCOUNTER — Encounter: Payer: Self-pay | Admitting: Family Medicine

## 2017-05-26 ENCOUNTER — Encounter: Payer: Self-pay | Admitting: Family Medicine

## 2017-06-03 ENCOUNTER — Other Ambulatory Visit: Payer: Self-pay | Admitting: Family Medicine

## 2017-06-03 DIAGNOSIS — R109 Unspecified abdominal pain: Secondary | ICD-10-CM

## 2017-06-20 ENCOUNTER — Ambulatory Visit: Payer: 59 | Admitting: Family Medicine

## 2017-07-03 ENCOUNTER — Encounter: Payer: Self-pay | Admitting: Gastroenterology

## 2017-08-08 IMAGING — US US ABDOMEN LIMITED
1 series · 14 of 25 positions shown · non-contrast
Comparison: None.

CLINICAL DATA: Right upper quadrant pain, vomiting.

EXAM:
US ABDOMEN LIMITED - RIGHT UPPER QUADRANT

[Series 1: us abdomen limited · 0.31mm/px · 14 of 30 slices shown]
[im 1/30]
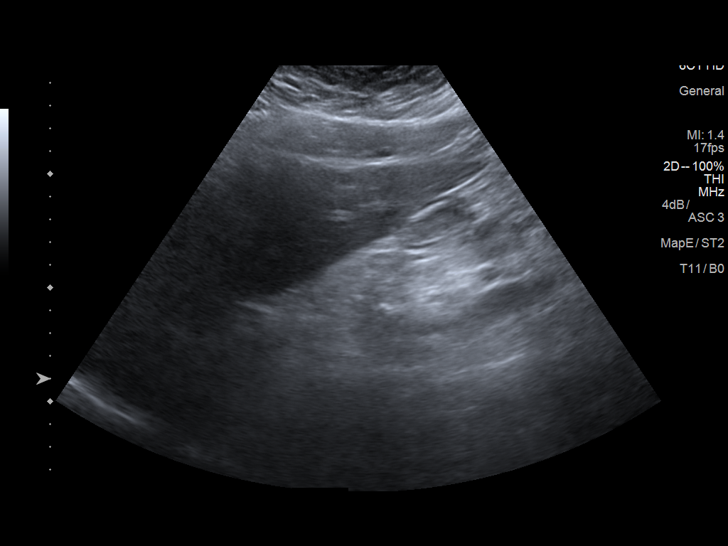
[im 3/30]
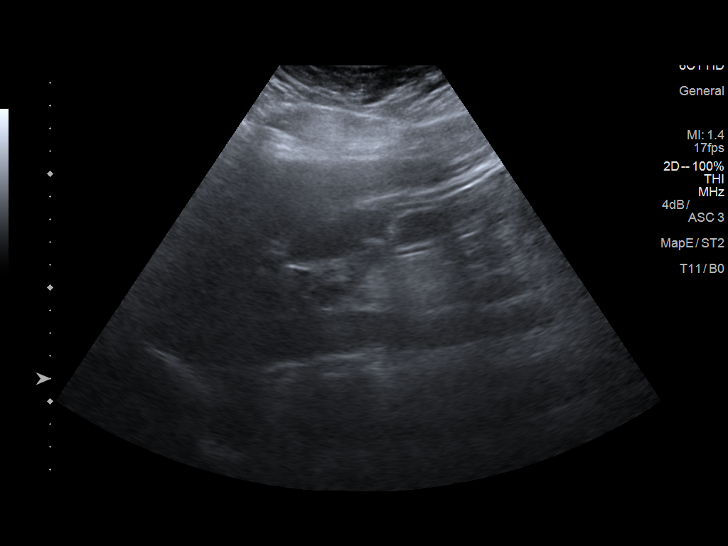
[im 5/30]
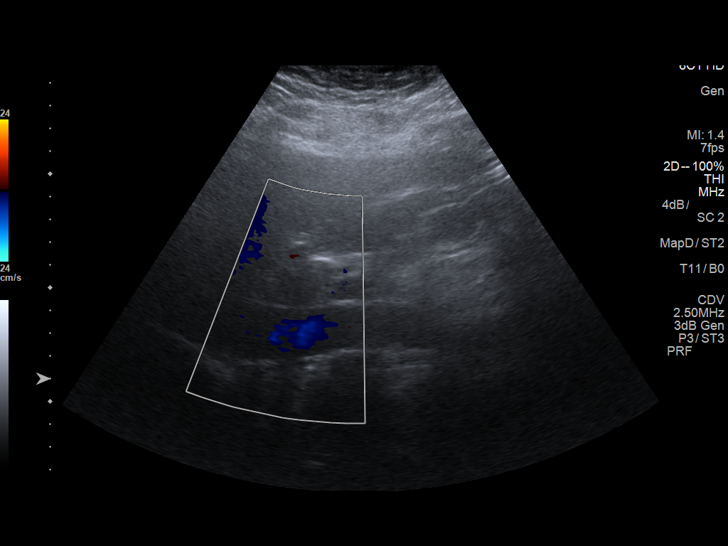
[im 8/30]
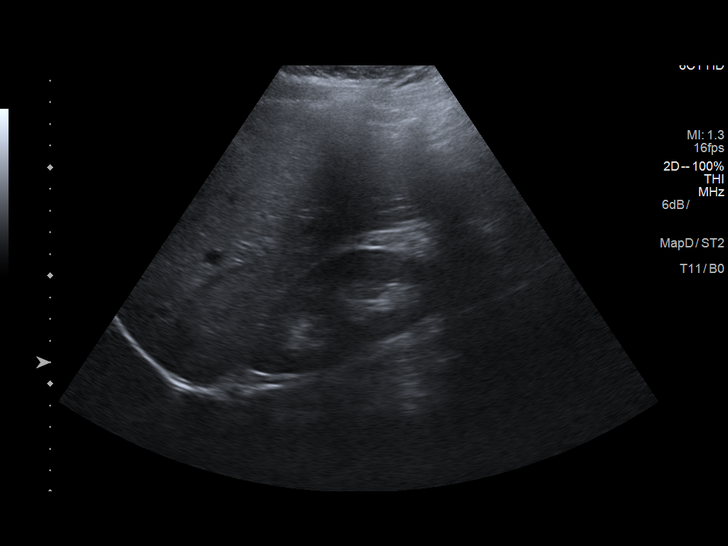
[im 10/30]
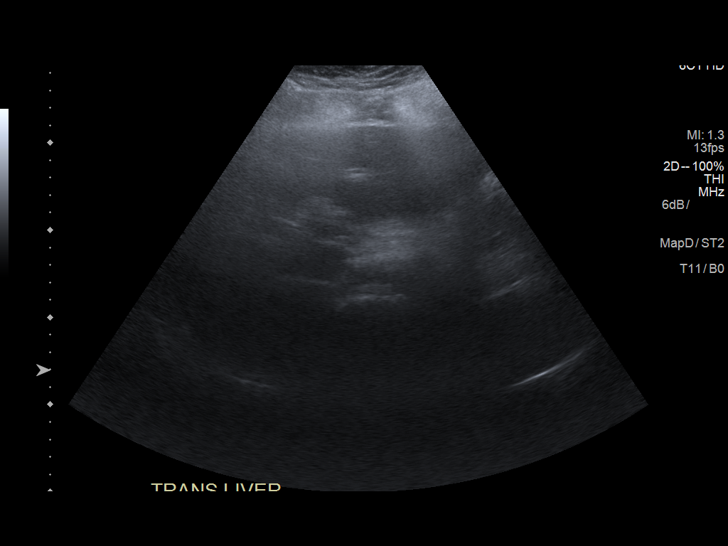
[im 11/30]
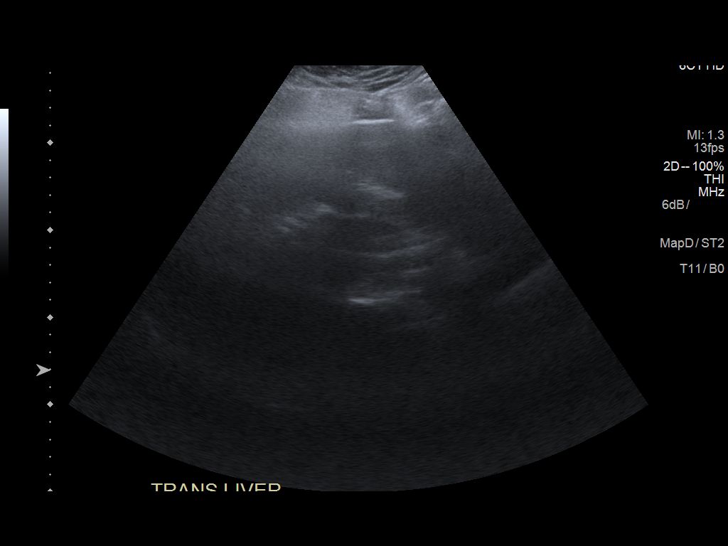
[im 14/30]
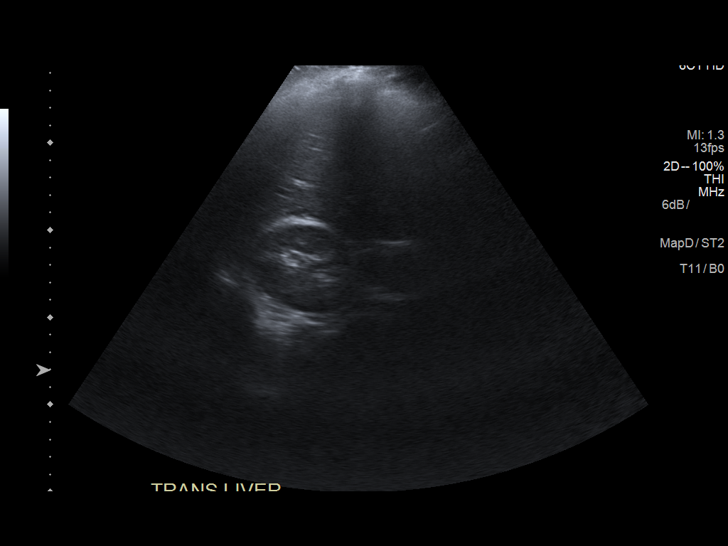
[im 16/30]
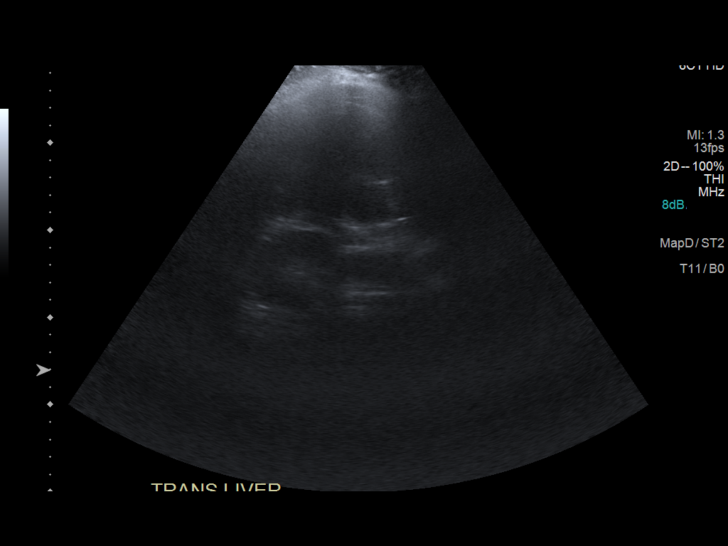
[im 19/30]
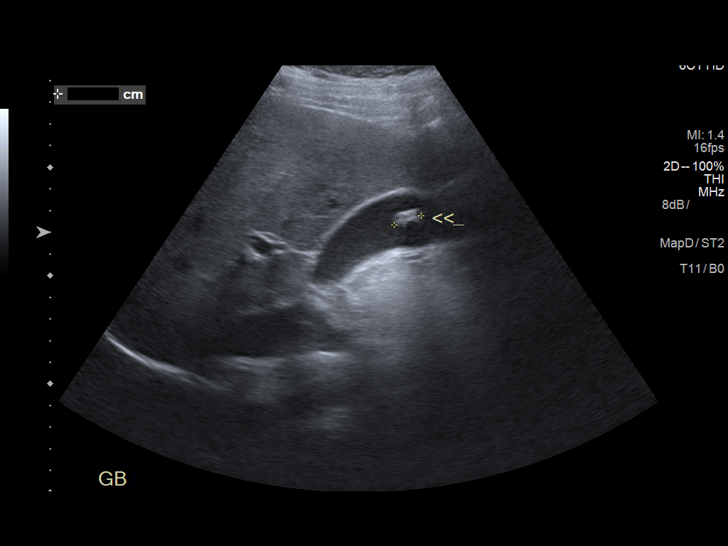
[im 20/30]
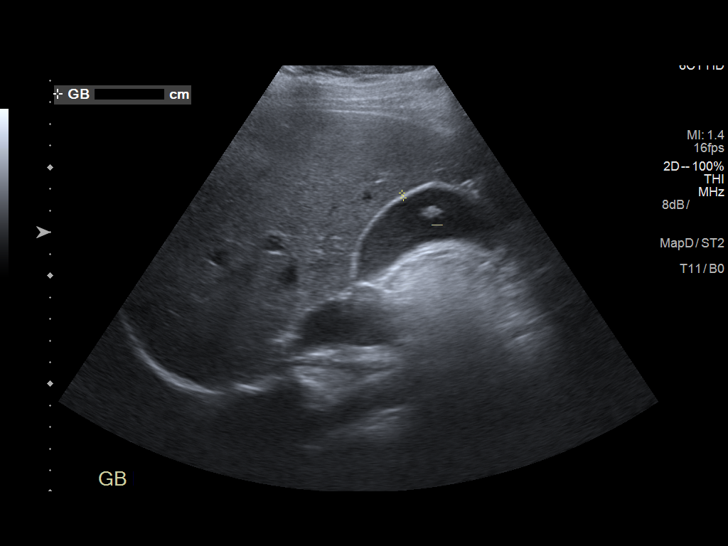
[im 22/30]
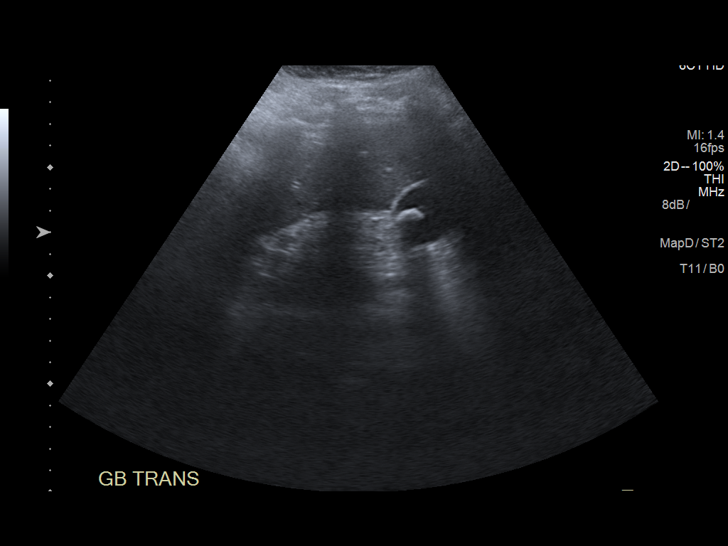
[im 25/30]
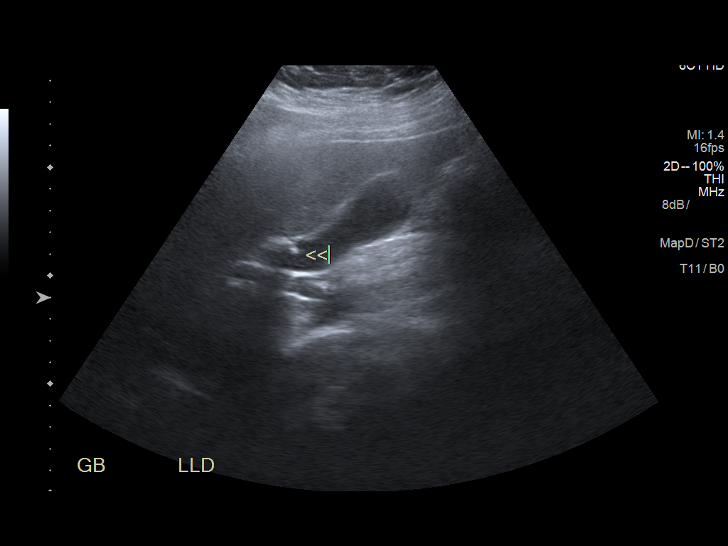
[im 27/30]
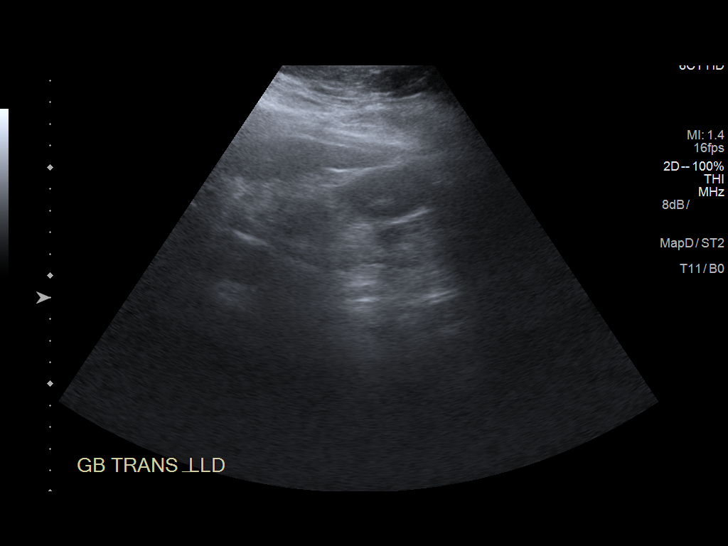
[im 30/30]
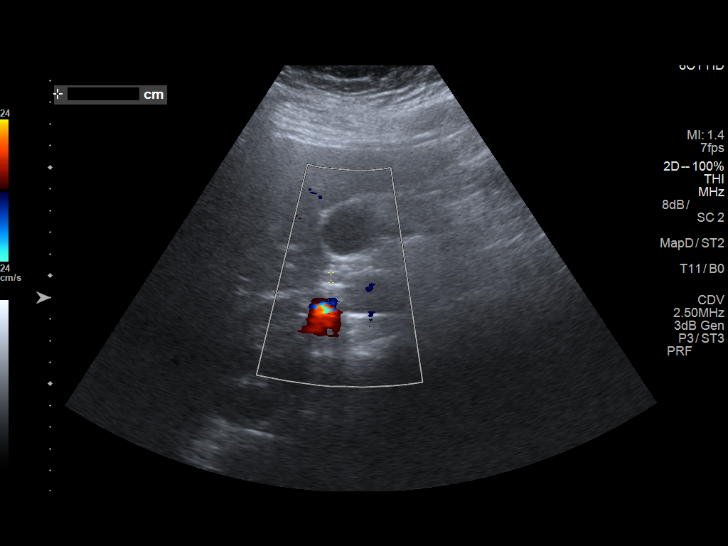

[14 of 25 positions shown; findings below may reference images not displayed]

FINDINGS: Gallbladder:

Two calculi, the largest measuring 13 mm. No gallbladder wall
thickening. No tenderness over the gallbladder.

Common bile duct:

Diameter: 4.6 mm

Liver:

No focal lesion identified. Within normal limits in parenchymal
echogenicity.
IMPRESSION: Cholelithiasis.  No sonographic evidence of acute cholecystitis.

## 2017-08-13 ENCOUNTER — Encounter: Payer: Self-pay | Admitting: Gastroenterology

## 2017-08-13 ENCOUNTER — Ambulatory Visit (INDEPENDENT_AMBULATORY_CARE_PROVIDER_SITE_OTHER): Payer: 59 | Admitting: Gastroenterology

## 2017-08-13 VITALS — BP 136/72 | HR 68 | Ht 64.0 in | Wt 323.4 lb

## 2017-08-13 DIAGNOSIS — R197 Diarrhea, unspecified: Secondary | ICD-10-CM | POA: Diagnosis not present

## 2017-08-13 MED ORDER — CHOLESTYRAMINE 4 G PO PACK: 4.0000 g | PACK | Freq: Every day | ORAL | 3 refills | Status: DC

## 2017-08-13 NOTE — Patient Instructions (Addendum)
Please start taking citrucel (orange flavored) powder fiber supplement.  This may cause some bloating at first but that usually goes away. Begin with a small spoonful and work your way up to a large, heaping spoonful daily over a week. Cholestyramine 4gm powder once daily, 1 month with 3 refills. Please return to see Dr. Christella HartiganJacobs in 2 months Normal BMI (Body Mass Index- based on height and weight) is between 19 and 25. Your BMI today is Body mass index is 55.51 kg/m. Marland Kitchen. Please consider follow up  regarding your BMI with your Primary Care Provider. .Marland Kitchen

## 2017-08-13 NOTE — Progress Notes (Signed)
HPI: This is a very pleasant 32 year old woman who was referred to me by [place], Betty G, MD  to evaluate intermittent abdominal pains, mild constipation, intermittent urgency.    Chief complaint is intermittent abdominal pains, alternating bowel habits, status post cholecystectomy  She has been having mild constipation with a bowel movement every 3 or 4 days.  Sometimes some cramping in her lower abdomen that is usually relieved with a bowel movement.  She has never seen blood in her stool.  Intermittently she will have a urgency after certain foods.  Greasy foods often cause this but not 100% of the time.  About 2 years ago she had her gallbladder removed and her symptoms persisted since then.  She was having problems also before her gallbladder similar to these and was also having some epigastric and right upper quadrant pains.  Those epigastric, right upper quadrant pains improved  Will take omeprazole rarely only; chocolate causes pyrosis especially.   Her weight is overall stable, morbidly obese with a BMI of 55    Old Data Reviewed: Impression/Plan by her previous GI doctor 09/2015, cut and pasted here: Naryah C Thornsberry ...comes in today with epigastric discomfort that radiates to the right side. The patient reports it is made worse with chocolate and spicy foods in addition to fatty foods. The patient had an ultrasound that had some gallstones but no sign of all bladder disease. The patient takes Prilosec as needed and states that it does help at times when she does it. The patient has been given samples of Dexilant 60 mg to be taken once a day. She will try this for a few weeks and if her symptoms do not improve she may need to be tried on dicyclomine for possible irritable bowel syndrome with her alternating diarrhea and constipation and bloating. The patient has been explained the plan and agrees with it.   Lap chole 12/2015 Dr. Ovidio Kin, for chronic cholecystitis,  cholelithiasis; uneventful per Op note, IOC was essentially normal.  Review of systems: Pertinent positive and negative review of systems were noted in the above HPI section. All other review negative.   Past Medical History:  Diagnosis Date  . ADD (attention deficit disorder)   . Allergy   . Anemia   . Anxiety state 09/07/2015  . Dysrhythmia   . Gastritis   . GERD (gastroesophageal reflux disease)   . Headache   . Pneumonia   . Pre-diabetes     Past Surgical History:  Procedure Laterality Date  . LAPAROSCOPIC CHOLECYSTECTOMY SINGLE SITE WITH INTRAOPERATIVE CHOLANGIOGRAM N/A 12/22/2015   Procedure: LAPAROSCOPIC CHOLECYSTECTOMY WITH INTRAOPERATIVE CHOLANGIOGRAM;  Surgeon: Ovidio Kin, MD;  Location: [facility] OR;  Service: General;  Laterality: N/A;  . TOOTH EXTRACTION  2000    Current Outpatient Medications  Medication Sig Dispense Refill  . omeprazole (PRILOSEC) 20 MG capsule Take 20 mg by mouth daily as needed.     No current facility-administered medications for this visit.     Allergies as of 08/13/2017 - Review Complete 08/13/2017  Allergen Reaction Noted  . Coffee bean extract [coffea arabica] Anaphylaxis 12/18/2015  . Other Other (See Comments) 11/11/2015  . Milk-related compounds Other (See Comments) 11/04/2014    Family History  Problem Relation Age of Onset  . Hyperlipidemia Brother   . Diabetes Paternal Uncle   . Colon cancer Neg Hx   . Esophageal cancer Neg Hx   . Pancreatic cancer Neg Hx   . Stomach cancer Neg Hx   .  Liver disease Neg Hx     Social History   Socioeconomic History  . Marital status: Single    Spouse name: Not on file  . Number of children: Not on file  . Years of education: Not on file  . Highest education level: Not on file  Occupational History  . Not on file  Social Needs  . Financial resource strain: Not on file  . Food insecurity:    Worry: Not on file    Inability: Not on file  . Transportation needs:    Medical: Not on  file    Non-medical: Not on file  Tobacco Use  . Smoking status: Former Games developermoker  . Smokeless tobacco: Never Used  Substance and Sexual Activity  . Alcohol use: No    Alcohol/week: 0.0 oz  . Drug use: No  . Sexual activity: Not on file  Lifestyle  . Physical activity:    Days per week: Not on file    Minutes per session: Not on file  . Stress: Not on file  Relationships  . Social connections:    Talks on phone: Not on file    Gets together: Not on file    Attends religious service: Not on file    Active member of club or organization: Not on file    Attends meetings of clubs or organizations: Not on file    Relationship status: Not on file  . Intimate partner violence:    Fear of current or ex partner: Not on file    Emotionally abused: Not on file    Physically abused: Not on file    Forced sexual activity: Not on file  Other Topics Concern  . Not on file  Social History Narrative  . Not on file     Physical Exam: BP 136/72   Pulse 68   Ht 5\' 4"  (1.626 m)   Wt (!) 323 lb 6.4 oz (146.7 kg)   BMI 55.51 kg/m  Constitutional: Morbidly obese otherwise well-appearing Psychiatric: alert and oriented x3 Eyes: extraocular movements intact Mouth: oral pharynx moist, no lesions Neck: supple no lymphadenopathy Cardiovascular: heart regular rate and rhythm Lungs: clear to auscultation bilaterally Abdomen: soft, mildly tender mid epigastrium, nondistended, no obvious ascites, no peritoneal signs, normal bowel sounds Extremities: no lower extremity edema bilaterally Skin: no lesions on visible extremities   Assessment and plan: 32 y.o. female with alternating mild constipation with urgency after eating, intermittent lower abdominal pains  I think some of her symptoms are probably functional.  She does have some alternating bowel habits and I recommended fiber supplement to try to even that out.  I am also giving her prescription for cholestyramine as perhaps some of the  urgency after eating especially greasy foods is related to bile acid dysregulation since her gallbladder was removed.  She will return to see me in 2 months and sooner if needed.  She has no alarm symptoms and I think there is no reason for any further blood tests imaging studies or endoscopic procedures at this point.    Please see the "Patient Instructions" section for addition details about the plan.   Rob Buntinganiel Taysia Rivere, MD [facility] 08/13/2017, 8:24 AM  Cc: [place], Betty G, MD

## 2017-09-15 ENCOUNTER — Ambulatory Visit (HOSPITAL_COMMUNITY)
Admission: EM | Admit: 2017-09-15 | Discharge: 2017-09-15 | Disposition: A | Discharge status: Home / Self Care | Payer: 59 | Attending: Family Medicine | Admitting: Family Medicine

## 2017-09-15 ENCOUNTER — Encounter (HOSPITAL_COMMUNITY): Payer: Self-pay | Admitting: Emergency Medicine

## 2017-09-15 DIAGNOSIS — M62830 Muscle spasm of back: Secondary | ICD-10-CM

## 2017-09-15 MED ORDER — NAPROXEN 500 MG PO TABS: 500.0000 mg | ORAL_TABLET | Freq: Two times a day (BID) | ORAL | 0 refills | Status: DC

## 2017-09-15 MED ORDER — BACLOFEN 10 MG PO TABS: 10.0000 mg | ORAL_TABLET | Freq: Three times a day (TID) | ORAL | 0 refills | Status: DC | PRN

## 2017-09-15 NOTE — ED Provider Notes (Signed)
[facility]   [id number] 09/15/17 Arrival Time: 1735  SUBJECTIVE: History from: patient. Valerie Green is a 32 y.o. female complains of lower midback pain that began 2 days ago.  Began after working on her car, and felt her back get tight.  Localizes the pain to the lower mid back.  Describes the pain as constant and achy in character.  Has tried cyclobenzaprine without relief.  Symptoms are made worse with walking and leaning forward.  Reports similar symptoms in the past.  Denies fever, chills, erythema, ecchymosis, effusion, weakness, numbness and tingling.      ROS: As per HPI.  Past Medical History:  Diagnosis Date  . ADD (attention deficit disorder)   . Allergy   . Anemia   . Anxiety state 09/07/2015  . Dysrhythmia   . Gastritis   . GERD (gastroesophageal reflux disease)   . Headache   . Pneumonia   . Pre-diabetes    Past Surgical History:  Procedure Laterality Date  . LAPAROSCOPIC CHOLECYSTECTOMY SINGLE SITE WITH INTRAOPERATIVE CHOLANGIOGRAM N/A 12/22/2015   Procedure: LAPAROSCOPIC CHOLECYSTECTOMY WITH INTRAOPERATIVE CHOLANGIOGRAM;  Surgeon: Ovidio Kin, MD;  Location: [facility] OR;  Service: General;  Laterality: N/A;  . TOOTH EXTRACTION  2000   Allergies  Allergen Reactions  . Coffee Bean Extract [Coffea Arabica] Anaphylaxis  . Other Other (See Comments)    Chocolate, Garlic, spices, citrus cause gastritis  . Milk-Related Compounds Other (See Comments)    unknown   No current facility-administered medications on file prior to encounter.    Current Outpatient Medications on File Prior to Encounter  Medication Sig Dispense Refill  . cholestyramine (QUESTRAN) 4 g packet Take 1 packet (4 g total) by mouth daily. 30 each 3  . omeprazole (PRILOSEC) 20 MG capsule Take 20 mg by mouth daily as needed.      Social History   Socioeconomic History  . Marital status: Single    Spouse name: Not on file  . Number of children: Not on file  . Years of education:  Not on file  . Highest education level: Not on file  Occupational History  . Not on file  Social Needs  . Financial resource strain: Not on file  . Food insecurity:    Worry: Not on file    Inability: Not on file  . Transportation needs:    Medical: Not on file    Non-medical: Not on file  Tobacco Use  . Smoking status: Former Games developer  . Smokeless tobacco: Never Used  Substance and Sexual Activity  . Alcohol use: No    Alcohol/week: 0.0 oz  . Drug use: No  . Sexual activity: Not on file  Lifestyle  . Physical activity:    Days per week: Not on file    Minutes per session: Not on file  . Stress: Not on file  Relationships  . Social connections:    Talks on phone: Not on file    Gets together: Not on file    Attends religious service: Not on file    Active member of club or organization: Not on file    Attends meetings of clubs or organizations: Not on file    Relationship status: Not on file  . Intimate partner violence:    Fear of current or ex partner: Not on file    Emotionally abused: Not on file    Physically abused: Not on file    Forced sexual activity: Not on file  Other Topics Concern  .  Not on file  Social History Narrative  . Not on file   Family History  Problem Relation Age of Onset  . Hyperlipidemia Brother   . Diabetes Paternal Uncle   . Colon cancer Neg Hx   . Esophageal cancer Neg Hx   . Pancreatic cancer Neg Hx   . Stomach cancer Neg Hx   . Liver disease Neg Hx     OBJECTIVE:  Vitals:   09/15/17 1804  BP: (!) 144/87  Pulse: 64  Resp: 18  Temp: 98.1 F (36.7 C)  SpO2: 100%    General appearance: AOx3; in no acute distress.  Head: NCAT Lungs: CTA bilaterally Heart: RRR.  Clear S1 and S2 without murmur, gallops, or rubs.  Radial pulses 2+ bilaterally. Musculoskeletal: Back  Inspection: Skin warm, dry, clear and intact without obvious erythema, effusion, or ecchymosis.  Palpation: Mildly tender left paravertebral muscles ROM: FROM  active and passive Strength: 5/5 shld abduction, 5/5 shld adduction, 5/5 elbow flexion, 5/5 elbow extension, 5/5 grip strength, 5/5 knee abduction, 5/5 knee adduction, 5/5 knee flexion, 5/5 knee extension, 5/5 dorsiflexion, 5/5 plantar flexion Skin: warm and dry Neurologic: Ambulates with minimal difficulty; Sensation intact about the upper/ lower extremities Psychological: alert and cooperative; normal mood and affect  ASSESSMENT & PLAN:  1. Spasm of muscle of lower back     Meds ordered this encounter  Medications  . baclofen (LIORESAL) 10 MG tablet    Sig: Take 1 tablet (10 mg total) by mouth 3 (three) times daily as needed for muscle spasms.    Dispense:  30 each    Refill:  0    Order Specific Question:   Supervising Provider    Answer:   Isa Rankin [phone]  . naproxen (NAPROSYN) 500 MG tablet    Sig: Take 1 tablet (500 mg total) by mouth 2 (two) times daily with a meal.    Dispense:  30 tablet    Refill:  0    Order Specific Question:   Supervising Provider    Answer:   Isa Rankin [[id number]]    Continue conservative management of rest, ice, and gentle stretches Take naproxen as needed for pain relief (may cause abdominal discomfort, ulcers, GI bleeds, and may raise blood pressure avoid taking with other NSAIDs) Do not take if breast feeding Take baclofen as needed for muscle spasm. Do not take while driving or operating heavy machinery.  Do not take if breast feeding Follow up with PCP if symptoms persist Present to ER if worsening or new symptoms (fever, chills, chest pain, abdominal pain, changes in bowel or bladder habits, pain radiating into lower legs, etc...)   Reviewed expectations re: course of current medical issues. Questions answered. Outlined signs and symptoms indicating need for more acute intervention. Patient verbalized understanding. After Visit Summary given.    Rennis Harding, PA-C 09/15/17 7564

## 2017-09-15 NOTE — Discharge Instructions (Signed)
Continue conservative management of rest, ice, and gentle stretches Take naproxen as needed for pain relief (may cause abdominal discomfort, ulcers, GI bleeds, and may raise blood pressure avoid taking with other NSAIDs) Do not take if breast feeding Take baclofen as needed for muscle spasm. Do not take while driving or operating heavy machinery.  Do not take if breast feeding Follow up with PCP if symptoms persist Present to ER if worsening or new symptoms (fever, chills, chest pain, abdominal pain, changes in bowel or bladder habits, pain radiating into lower legs, etc...)

## 2017-09-15 NOTE — ED Triage Notes (Signed)
Pt c/o back pain and spasm, states she was working on her car and felt her back get tight. Painful with bending over.

## 2017-10-14 ENCOUNTER — Ambulatory Visit: Payer: 59 | Admitting: Gastroenterology

## 2017-11-25 ENCOUNTER — Ambulatory Visit (HOSPITAL_COMMUNITY)
Admission: EM | Admit: 2017-11-25 | Discharge: 2017-11-25 | Disposition: A | Discharge status: Home / Self Care | Payer: 59 | Attending: Family Medicine | Admitting: Family Medicine

## 2017-11-25 ENCOUNTER — Encounter (HOSPITAL_COMMUNITY): Payer: Self-pay | Admitting: Emergency Medicine

## 2017-11-25 DIAGNOSIS — J039 Acute tonsillitis, unspecified: Secondary | ICD-10-CM | POA: Diagnosis not present

## 2017-11-25 DIAGNOSIS — Z87891 Personal history of nicotine dependence: Secondary | ICD-10-CM | POA: Insufficient documentation

## 2017-11-25 DIAGNOSIS — J029 Acute pharyngitis, unspecified: Secondary | ICD-10-CM | POA: Diagnosis present

## 2017-11-25 DIAGNOSIS — Z79899 Other long term (current) drug therapy: Secondary | ICD-10-CM | POA: Diagnosis not present

## 2017-11-25 LAB — POCT INFECTIOUS MONO SCREEN: MONO SCREEN: NEGATIVE

## 2017-11-25 LAB — POCT RAPID STREP A: Streptococcus, Group A Screen (Direct): NEGATIVE

## 2017-11-25 MED ORDER — AMOXICILLIN-POT CLAVULANATE 875-125 MG PO TABS: 1.0000 | ORAL_TABLET | Freq: Two times a day (BID) | ORAL | 0 refills | Status: AC

## 2017-11-25 NOTE — Discharge Instructions (Signed)
Sore Throat  Strep and mono negative.  But given appearance of throat we will go ahead and start you on a different antibiotic-begin Augmentin twice daily for the next 10 days  Please continue Tylenol or Ibuprofen for fever and pain. May try salt water gargles, cepacol lozenges, throat spray, or OTC cold relief medicine for throat discomfort. If you also have congestion take a daily anti-histamine like Zyrtec, Claritin, and a oral decongestant to help with post nasal drip that may be irritating your throat.   Stay hydrated and drink plenty of fluids to keep your throat coated relieve irritation.

## 2017-11-25 NOTE — ED Provider Notes (Signed)
[facility]    CSN: [id number] Arrival date & time: 11/25/17  1036     History   Chief Complaint Chief Complaint  Patient presents with  . Sore Throat    HPI Valerie Green is a 32 y.o. female negative past medical history presenting today for evaluation of sore throat.  Her sore throat began last night, but notes that her symptoms initially began 3 weeks ago with sinus symptoms, congestion and sore throat, her symptoms improved, but a week ago she developed a worsening sore throat and was treated with clindamycin.  She denies testing positive for strep.  She denies associated congestion and cough at this time.  Noting difficulty swallowing.  Has not taken anything for symptoms at this time.  HPI  Past Medical History:  Diagnosis Date  . ADD (attention deficit disorder)   . Allergy   . Anemia   . Anxiety state 09/07/2015  . Dysrhythmia   . Gastritis   . GERD (gastroesophageal reflux disease)   . Headache   . Pneumonia   . Pre-diabetes     Patient Active Problem List   Diagnosis Date Noted  . Obstructive sleep apnea 02/23/2016  . Insomnia 01/19/2016  . BMI 50.0-59.9, adult (HCC) 01/19/2016  . S/P laparoscopic cholecystectomy August 2017 12/23/2015  . Cholecystitis 12/22/2015  . Anxiety disorder 09/07/2015    Past Surgical History:  Procedure Laterality Date  . LAPAROSCOPIC CHOLECYSTECTOMY SINGLE SITE WITH INTRAOPERATIVE CHOLANGIOGRAM N/A 12/22/2015   Procedure: LAPAROSCOPIC CHOLECYSTECTOMY WITH INTRAOPERATIVE CHOLANGIOGRAM;  Surgeon: Ovidio Kin, MD;  Location: [facility] OR;  Service: General;  Laterality: N/A;  . TOOTH EXTRACTION  2000    OB History    Gravida  1   Para      Term      Preterm      AB      Living        SAB      TAB      Ectopic      Multiple      Live Births               Home Medications    Prior to Admission medications   Medication Sig Start Date End Date Taking? Authorizing Provider    amoxicillin-clavulanate (AUGMENTIN) 875-125 MG tablet Take 1 tablet by mouth every 12 (twelve) hours for 10 days. 11/25/17 12/05/17  Wieters, Hallie C, PA-C  baclofen (LIORESAL) 10 MG tablet Take 1 tablet (10 mg total) by mouth 3 (three) times daily as needed for muscle spasms. 09/15/17   Wurst, [place], PA-C  cholestyramine (QUESTRAN) 4 g packet Take 1 packet (4 g total) by mouth daily. 08/13/17   Rachael Fee, MD  naproxen (NAPROSYN) 500 MG tablet Take 1 tablet (500 mg total) by mouth 2 (two) times daily with a meal. 09/15/17   Wurst, [place], PA-C  omeprazole (PRILOSEC) 20 MG capsule Take 20 mg by mouth daily as needed.    [provider]    Family History Family History  Problem Relation Age of Onset  . Hyperlipidemia Brother   . Diabetes Paternal Uncle   . Colon cancer Neg Hx   . Esophageal cancer Neg Hx   . Pancreatic cancer Neg Hx   . Stomach cancer Neg Hx   . Liver disease Neg Hx     Social History Social History   Tobacco Use  . Smoking status: Former Games developer  . Smokeless tobacco: Never Used  Substance Use Topics  .  Alcohol use: No    Alcohol/week: 0.0 oz  . Drug use: No     Allergies   Coffee bean extract [coffea arabica]; Other; and Milk-related compounds   Review of Systems Review of Systems  Constitutional: Negative for activity change, appetite change, chills, fatigue and fever.  HENT: Positive for sore throat. Negative for congestion, ear pain, rhinorrhea, sinus pressure and trouble swallowing.   Eyes: Negative for discharge and redness.  Respiratory: Negative for cough, chest tightness and shortness of breath.   Cardiovascular: Negative for chest pain.  Gastrointestinal: Negative for abdominal pain, diarrhea, nausea and vomiting.  Musculoskeletal: Negative for myalgias.  Skin: Negative for rash.  Neurological: Negative for dizziness, light-headedness and headaches.     Physical Exam Triage Vital Signs ED Triage Vitals [11/25/17 1056]  Enc  Vitals Group     BP (!) 155/85     Pulse Rate 75     Resp 18     Temp 98.6 F (37 C)     Temp Source Oral     SpO2 98 %     Weight      Height      Head Circumference      Peak Flow      Pain Score      Pain Loc      Pain Edu?      Excl. in GC?    No data found.  Updated Vital Signs BP (!) 155/85 (BP Location: Left Arm)   Pulse 75   Temp 98.6 F (37 C) (Oral)   Resp 18   SpO2 98%   Visual Acuity Right Eye Distance:   Left Eye Distance:   Bilateral Distance:    Right Eye Near:   Left Eye Near:    Bilateral Near:     Physical Exam  Constitutional: She appears well-developed and well-nourished. No distress.  HENT:  Head: Normocephalic and atraumatic.  Bilateral ears without tenderness to palpation of external auricle, tragus and mastoid, EAC's without erythema or swelling, TM's with good bony landmarks and cone of light. Non erythematous.  Oral mucosa pink and moist, moderate tonsillar enlargement bilaterally with white exudate bilaterally. Posterior pharynx patent and erythematous, no uvula deviation or swelling. Normal phonation.  Eyes: Conjunctivae are normal.  Neck: Neck supple.  Cardiovascular: Normal rate and regular rhythm.  No murmur heard. Pulmonary/Chest: Effort normal and breath sounds normal. No respiratory distress.  Breathing comfortably at rest, CTABL, no wheezing, rales or other adventitious sounds auscultated  Abdominal: Soft. There is no tenderness.  Musculoskeletal: She exhibits no edema.  Neurological: She is alert.  Skin: Skin is warm and dry.  Psychiatric: She has a normal mood and affect.  Nursing note and vitals reviewed.    UC Treatments / Results  Labs (all labs ordered are listed, but only abnormal results are displayed) Labs Reviewed  CULTURE, GROUP A STREP [facility])  POCT RAPID STREP A  POCT INFECTIOUS MONO SCREEN    EKG None  Radiology No results found.  Procedures Procedures (including critical care  time)  Medications Ordered in UC Medications - No data to display  Initial Impression / Assessment and Plan / UC Course  I have reviewed the triage vital signs and the nursing notes.  Pertinent labs & imaging results that were available during my care of the patient were reviewed by me and considered in my medical decision making (see chart for details).     Patient with tonsillitis, strep test and mono both negative.  Given appearance of throat would like to empirically treat with Augmentin.  Discussed further OTC measures to have better control sore throat.  Recommendations below.Discussed strict return precautions. Patient verbalized understanding and is agreeable with plan.  Final Clinical Impressions(s) / UC Diagnoses   Final diagnoses:  Tonsillitis     Discharge Instructions     Sore Throat  Strep and mono negative.  But given appearance of throat we will go ahead and start you on a different antibiotic-begin Augmentin twice daily for the next 10 days  Please continue Tylenol or Ibuprofen for fever and pain. May try salt water gargles, cepacol lozenges, throat spray, or OTC cold relief medicine for throat discomfort. If you also have congestion take a daily anti-histamine like Zyrtec, Claritin, and a oral decongestant to help with post nasal drip that may be irritating your throat.   Stay hydrated and drink plenty of fluids to keep your throat coated relieve irritation.     ED Prescriptions    Medication Sig Dispense Auth. Provider   amoxicillin-clavulanate (AUGMENTIN) 875-125 MG tablet Take 1 tablet by mouth every 12 (twelve) hours for 10 days. 20 tablet Wieters, [place]Hall[place] C, PA-C     Controlled Substance Prescriptions [place] Controlled Substance Registry consulted? Not Applicable   Lew DawesWieters, Hallie C, [place] JerseyPA-C 11/25/17 1143

## 2017-11-25 NOTE — ED Triage Notes (Signed)
Pt here for sore throat and URI sx  

## 2017-11-26 LAB — CULTURE, GROUP A STREP (THRC)

## 2017-11-29 ENCOUNTER — Telehealth (HOSPITAL_COMMUNITY): Payer: Self-pay

## 2017-11-29 NOTE — Telephone Encounter (Signed)
Culture is positive for non group A Strep germ.  This is a finding of uncertain significance; not the typical 'strep throat' germ.  Pt was treated with penicillin at ucc visit. Pt called and made aware.

## 2017-12-28 ENCOUNTER — Encounter: Payer: Self-pay | Admitting: Emergency Medicine

## 2017-12-28 DIAGNOSIS — R51 Headache: Secondary | ICD-10-CM | POA: Insufficient documentation

## 2017-12-28 DIAGNOSIS — F17200 Nicotine dependence, unspecified, uncomplicated: Secondary | ICD-10-CM | POA: Insufficient documentation

## 2017-12-28 DIAGNOSIS — F909 Attention-deficit hyperactivity disorder, unspecified type: Secondary | ICD-10-CM | POA: Diagnosis not present

## 2017-12-28 DIAGNOSIS — Z9049 Acquired absence of other specified parts of digestive tract: Secondary | ICD-10-CM | POA: Insufficient documentation

## 2017-12-28 DIAGNOSIS — K219 Gastro-esophageal reflux disease without esophagitis: Secondary | ICD-10-CM | POA: Insufficient documentation

## 2017-12-28 DIAGNOSIS — Z79899 Other long term (current) drug therapy: Secondary | ICD-10-CM | POA: Diagnosis not present

## 2017-12-28 DIAGNOSIS — F419 Anxiety disorder, unspecified: Secondary | ICD-10-CM | POA: Diagnosis not present

## 2017-12-28 NOTE — ED Triage Notes (Signed)
Patient with complaint of headache, intermittent chest pain and dizziness that started this morning. Patient states that she thinks it is a migraine but is concerned because of the chest pain.

## 2017-12-29 ENCOUNTER — Emergency Department
Admission: EM | Admit: 2017-12-29 | Discharge: 2017-12-29 | Disposition: A | Discharge status: Home / Self Care | Payer: 59 | Attending: Emergency Medicine | Admitting: Emergency Medicine

## 2017-12-29 ENCOUNTER — Emergency Department: Payer: 59

## 2017-12-29 DIAGNOSIS — K219 Gastro-esophageal reflux disease without esophagitis: Secondary | ICD-10-CM

## 2017-12-29 DIAGNOSIS — R0789 Other chest pain: Secondary | ICD-10-CM

## 2017-12-29 DIAGNOSIS — R51 Headache: Secondary | ICD-10-CM

## 2017-12-29 DIAGNOSIS — R519 Headache, unspecified: Secondary | ICD-10-CM

## 2017-12-29 LAB — CBC
HEMATOCRIT: 34.5 % — AB (ref 35.0–47.0)
HEMOGLOBIN: 12.4 g/dL (ref 12.0–16.0)
MCH: 31 pg (ref 26.0–34.0)
MCHC: 35.8 g/dL (ref 32.0–36.0)
MCV: 86.6 fL (ref 80.0–100.0)
Platelets: 207 10*3/uL (ref 150–440)
RBC: 3.98 MIL/uL (ref 3.80–5.20)
RDW: 13.3 % (ref 11.5–14.5)
WBC: 7.1 10*3/uL (ref 3.6–11.0)

## 2017-12-29 LAB — BASIC METABOLIC PANEL
Anion gap: 3 — ABNORMAL LOW (ref 5–15)
BUN: 10 mg/dL (ref 6–20)
CO2: 26 mmol/L (ref 22–32)
Calcium: 8.4 mg/dL — ABNORMAL LOW (ref 8.9–10.3)
Chloride: 108 mmol/L (ref 98–111)
Creatinine, Ser: 0.69 mg/dL (ref 0.44–1.00)
GFR calc Af Amer: >60 mL/min (ref >60)
GLUCOSE: 125 mg/dL — AB (ref 70–99)
Potassium: 3.4 mmol/L — ABNORMAL LOW (ref 3.5–5.1)
SODIUM: 137 mmol/L (ref 135–145)

## 2017-12-29 LAB — URINALYSIS, COMPLETE (UACMP) WITH MICROSCOPIC
BACTERIA UA: NONE SEEN
BILIRUBIN URINE: NEGATIVE
Glucose, UA: NEGATIVE mg/dL
Hgb urine dipstick: NEGATIVE
KETONES UR: NEGATIVE mg/dL
Nitrite: NEGATIVE
PH: 5 (ref 5.0–8.0)
Protein, ur: NEGATIVE mg/dL
SPECIFIC GRAVITY, URINE: 1.029 (ref 1.005–1.030)

## 2017-12-29 LAB — POCT PREGNANCY, URINE: PREG TEST UR: NEGATIVE

## 2017-12-29 LAB — TROPONIN I

## 2017-12-29 NOTE — ED Notes (Signed)
Pt resting in waiting room with eyes closed, even respirations

## 2017-12-29 NOTE — ED Notes (Signed)
Pt awake, ambulatory with steady gait to stat desk to ask for warm blanket; one provided as requested

## 2017-12-29 NOTE — Discharge Instructions (Signed)
Your workup in the Emergency Department today was reassuring.  We did not find any specific abnormalities.  We recommend you drink plenty of fluids, take your regular medications and/or any new ones prescribed today, and follow up with the doctor(s) listed in these documents as recommended.  Return to the Emergency Department if you develop new or worsening symptoms that concern you.  

## 2017-12-29 NOTE — ED Provider Notes (Signed)
[facility] Emergency Department Provider Note  ____________________________________________   First MD Initiated Contact with Patient 12/29/17 0321     (approximate)  I have reviewed the triage vital signs and the nursing notes.   HISTORY  Chief Complaint Headache and Chest Pain    HPI Jayleen Chelcea Zahn is a 32 y.o. female with medical history as listed below presents for evaluation of a variety of complaints.  Most notably she reports that she has had a headache all day today, gradual in onset and that feels consistent with her prior migraines.  Nothing particular makes it better or worse.  It is primarily behind her eyes and on both sides of the front of her head.  Some photophobia but relatively minimal.  Some nausea but that has resolved.  She also reports having some chest pain that feels like a burning sensation similar to her prior acid reflux.  Earlier today she felt lightheaded and was having "hot flashes" which is new to her and the combination of the symptoms scared her, mostly the chest pain, so that is what brought her in.  She says that after getting some sleep in the exam room she feels much better now and has only a mild residual headache.  No visual changes, denies fever/chills (other than the "hot flashes").  She denies chest pain and shortness of breath.  No dysuria.  Past Medical History:  Diagnosis Date  . ADD (attention deficit disorder)   . Allergy   . Anemia   . Anxiety state 09/07/2015  . Dysrhythmia   . Gastritis   . GERD (gastroesophageal reflux disease)   . Headache   . Pneumonia   . Pre-diabetes     Patient Active Problem List   Diagnosis Date Noted  . Obstructive sleep apnea 02/23/2016  . Insomnia 01/19/2016  . BMI 50.0-59.9, adult (HCC) 01/19/2016  . S/P laparoscopic cholecystectomy August 2017 12/23/2015  . Cholecystitis 12/22/2015  . Anxiety disorder 09/07/2015    Past Surgical History:  Procedure Laterality  Date  . LAPAROSCOPIC CHOLECYSTECTOMY SINGLE SITE WITH INTRAOPERATIVE CHOLANGIOGRAM N/A 12/22/2015   Procedure: LAPAROSCOPIC CHOLECYSTECTOMY WITH INTRAOPERATIVE CHOLANGIOGRAM;  Surgeon: Ovidio Kin, MD;  Location: [facility] OR;  Service: General;  Laterality: N/A;    Prior to Admission medications   Medication Sig Start Date End Date Taking? Authorizing Provider  baclofen (LIORESAL) 10 MG tablet Take 1 tablet (10 mg total) by mouth 3 (three) times daily as needed for muscle spasms. 09/15/17   Wurst, [place], PA-C  cholestyramine (QUESTRAN) 4 g packet Take 1 packet (4 g total) by mouth daily. 08/13/17   Rachael Fee, MD  naproxen (NAPROSYN) 500 MG tablet Take 1 tablet (500 mg total) by mouth 2 (two) times daily with a meal. 09/15/17   Wurst, [place], PA-C  omeprazole (PRILOSEC) 20 MG capsule Take 20 mg by mouth daily as needed.    [provider]    Allergies Coffee bean extract [coffea arabica]; Other; Milk-related compounds; and Penicillins  Family History  Problem Relation Age of Onset  . Hyperlipidemia Brother   . Diabetes Paternal Uncle   . Colon cancer Neg Hx   . Esophageal cancer Neg Hx   . Pancreatic cancer Neg Hx   . Stomach cancer Neg Hx   . Liver disease Neg Hx     Social History Social History   Tobacco Use  . Smoking status: Current Some Day Smoker  . Smokeless tobacco: Never Used  Substance Use Topics  .  Alcohol use: Yes    Alcohol/week: 0.0 standard drinks    Comment: occ  . Drug use: No    Review of Systems Constitutional: No fever/chills but "hot flashes" Eyes: No visual changes.  Some mild photophobia. ENT: No sore throat. Cardiovascular: Burning chest pain as described above, now resolved Respiratory: Denies shortness of breath. Gastrointestinal: No abdominal pain.  No nausea, no vomiting.  No diarrhea.  No constipation. Genitourinary: Negative for dysuria. Musculoskeletal: Negative for neck pain.  Negative for back pain. Integumentary: Negative  for rash. Neurological: Frontal headache as described above, no focal weakness or numbness.  Some lightheadedness and weakness as described above, now resolved   ____________________________________________   PHYSICAL EXAM:  VITAL SIGNS: ED Triage Vitals [12/28/17 2346]  Enc Vitals Group     BP (!) 145/98     Pulse Rate 81     Resp 18     Temp 97.9 F (36.6 C)     Temp Source Oral     SpO2 100 %     Weight (!) 144.2 kg (318 lb)     Height 1.626 m (5\' 4" )     Head Circumference      Peak Flow      Pain Score 9     Pain Loc      Pain Edu?      Excl. in GC?     Constitutional: Alert and oriented. Well appearing and in no acute distress. Eyes: Conjunctivae are normal. PERRL. EOMI. Head: Atraumatic. Nose: No congestion/rhinnorhea. Mouth/Throat: Mucous membranes are moist. Neck: No stridor.  No meningeal signs.   Cardiovascular: Normal rate, regular rhythm. Good peripheral circulation. Grossly normal heart sounds. Respiratory: Normal respiratory effort.  No retractions. Lungs CTAB. Gastrointestinal: Soft and nontender. No distention.  Musculoskeletal: No lower extremity tenderness nor edema. No gross deformities of extremities. Neurologic:  Normal speech and language. No gross focal neurologic deficits are appreciated.  Skin:  Skin is warm, dry and intact. No rash noted. Psychiatric: Mood and affect are normal. Speech and behavior are normal.  ____________________________________________   LABS (all labs ordered are listed, but only abnormal results are displayed)  Labs Reviewed  BASIC METABOLIC PANEL - Abnormal; Notable for the following components:      Result Value   Potassium 3.4 (*)    Glucose, Bld 125 (*)    Calcium 8.4 (*)    Anion gap 3 (*)    All other components within normal limits  CBC - Abnormal; Notable for the following components:   HCT 34.5 (*)    All other components within normal limits  URINALYSIS, COMPLETE (UACMP) WITH MICROSCOPIC - Abnormal;  Notable for the following components:   Color, Urine YELLOW (*)    APPearance HAZY (*)    Leukocytes, UA MODERATE (*)    All other components within normal limits  TROPONIN I  POC URINE PREG, ED  POCT PREGNANCY, URINE   ____________________________________________  EKG  ED ECG REPORT I, Loleta Rose, the attending physician, personally viewed and interpreted this ECG.  Date: 12/28/2017 EKG Time: 23: 50 Rate: 55 Rhythm: Sinus bradycardia QRS Axis: normal Intervals: normal ST/T Wave abnormalities: Inverted T wave in lead V1, otherwise unremarkable Narrative Interpretation: no evidence of acute ischemia   ____________________________________________  RADIOLOGY I, Loleta Rose, personally viewed and evaluated these images (plain radiographs) as part of my medical decision making, as well as reviewing the written report by the radiologist.  ED MD interpretation: No acute abnormalities identified on x-ray  Official radiology report(s): Dg Chest 2 View  Result Date: 12/29/2017 CLINICAL DATA:  Chest pain EXAM: CHEST - 2 VIEW COMPARISON:  06/27/2014 FINDINGS: Heart is mildly enlarged. No confluent opacities, effusions or edema. No acute bony abnormality. IMPRESSION: Cardiomegaly.  No active disease. Electronically Signed   By: Charlett NoseKevin  Dover M.D.   On: 12/29/2017 01:22    ____________________________________________   PROCEDURES  Critical Care performed: No   Procedure(s) performed:   Procedures   ____________________________________________   INITIAL IMPRESSION / ASSESSMENT AND PLAN / ED COURSE  As part of my medical decision making, I reviewed the following data within the electronic MEDICAL RECORD NUMBER Nursing notes reviewed and incorporated, Labs reviewed , EKG interpreted , Radiograph reviewed  and Notes from prior ED visits    Differential diagnosis includes, but is not limited to, migraine, cluster headache, pseudotumor cerebri, spontaneous intracranial bleed,  much less likely temporal arteritis.  Chest pain could be the result of acid reflux, less likely ACS or PE.  Patient is PERC negative and low risk for ACS based on HEART score.  Symptoms have almost completely resolved after some sleep including her headache.  I have no concern for any acute or emergent medical conditions that are resulting in her symptoms.  I offered my usual migraine cocktail but the patient says she feels better after some sleep and as long as her heart appears okay she is ready to go home, I reassured her that there is no evidence that she is having any problems with her heart and I do not feel that there is an indication for a second troponin at this time based on her very low risk for ACS.  I gave my usual and customary return precautions.     ____________________________________________  FINAL CLINICAL IMPRESSION(S) / ED DIAGNOSES  Final diagnoses:  Acute nonintractable headache, unspecified headache type  Atypical chest pain  Gastroesophageal reflux disease, esophagitis presence not specified     MEDICATIONS GIVEN DURING THIS VISIT:  Medications - No data to display   ED Discharge Orders    None       Note:  This document was prepared using Dragon voice recognition software and may include unintentional dictation errors.    Loleta RoseForbach, Guynell Kleiber, MD 12/29/17 [phone]

## 2018-04-12 IMAGING — US US ABDOMEN LIMITED
1 series · 14 of 25 positions shown · non-contrast
Comparison: Limited abdominal sonogram March 09, 2015

CLINICAL DATA: RIGHT upper quadrant pain beginning at 4 p.m.
History of cholelithiasis.

EXAM:
US ABDOMEN LIMITED - RIGHT UPPER QUADRANT

[Series 1: us abdomen limited · 0.23mm/px · 14 of 41 slices shown]
[im 1/41]
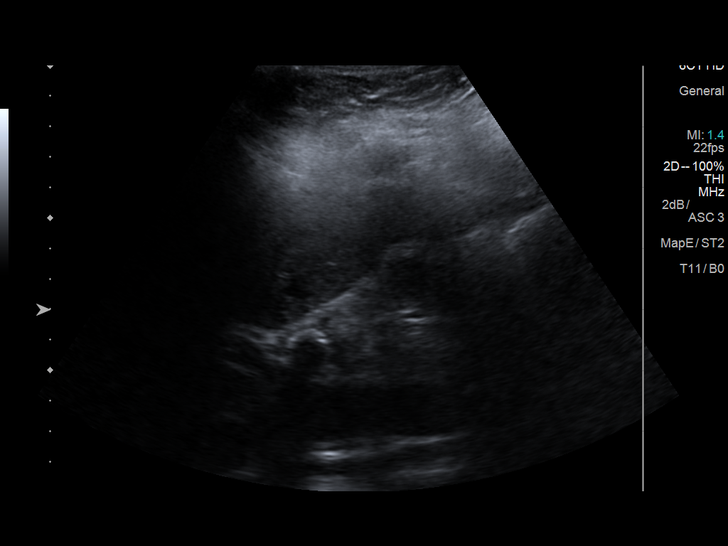
[im 4/41]
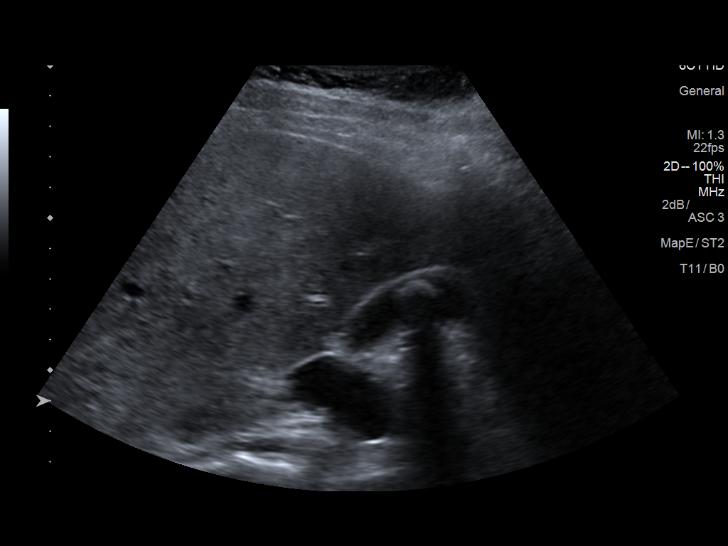
[im 7/41]
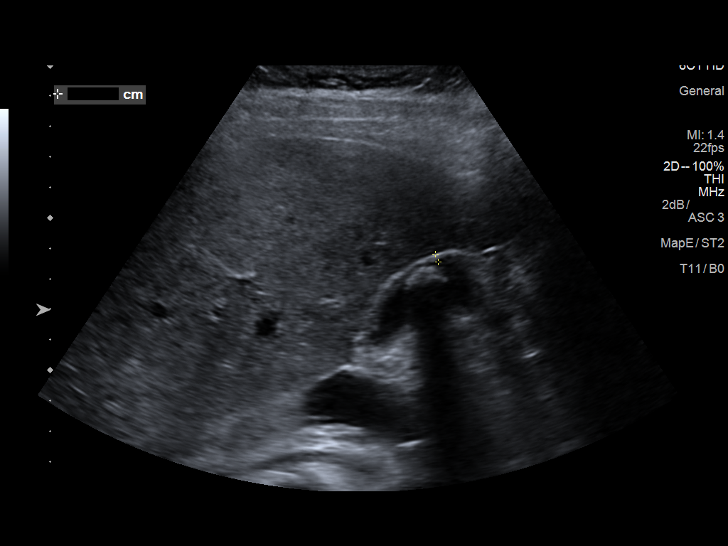
[im 11/41]
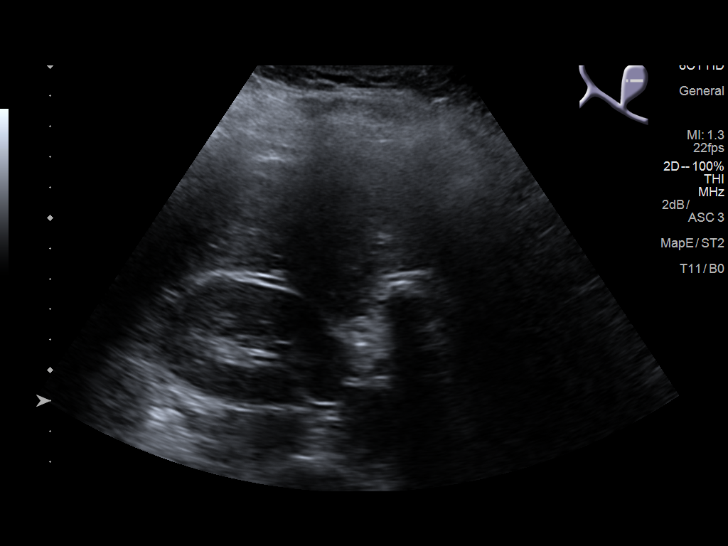
[im 14/41]
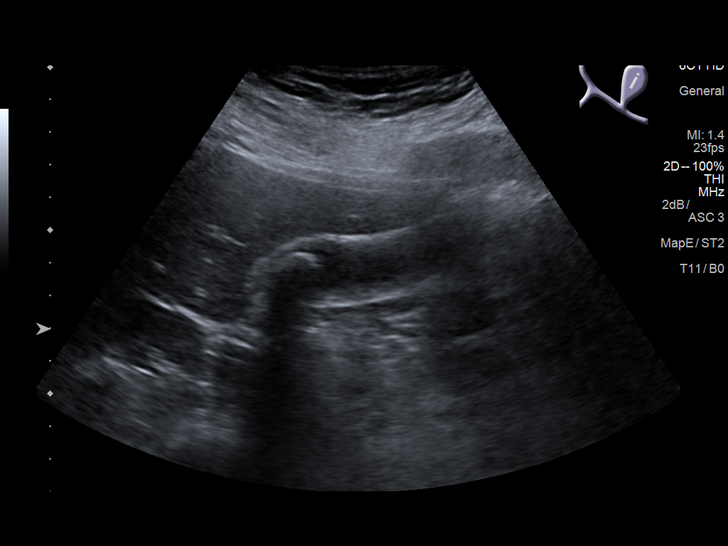
[im 16/41]
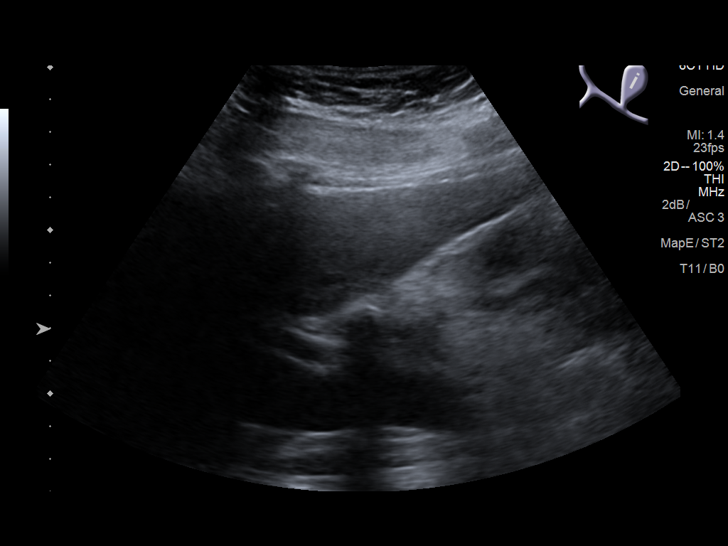
[im 19/41]
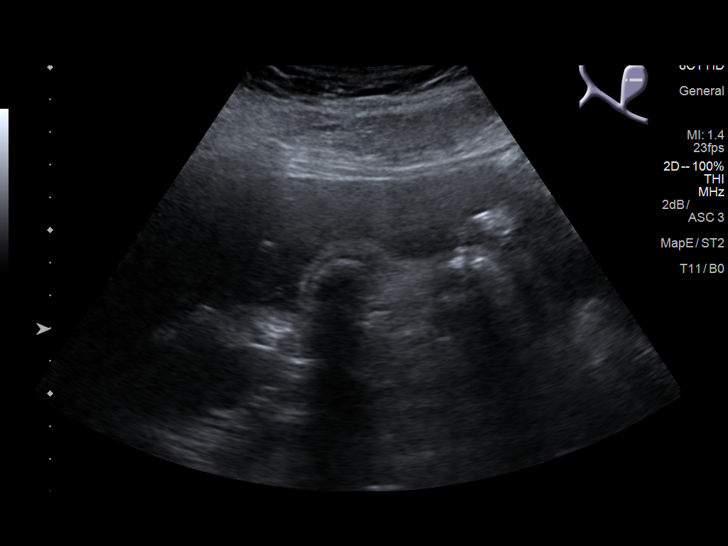
[im 22/41]
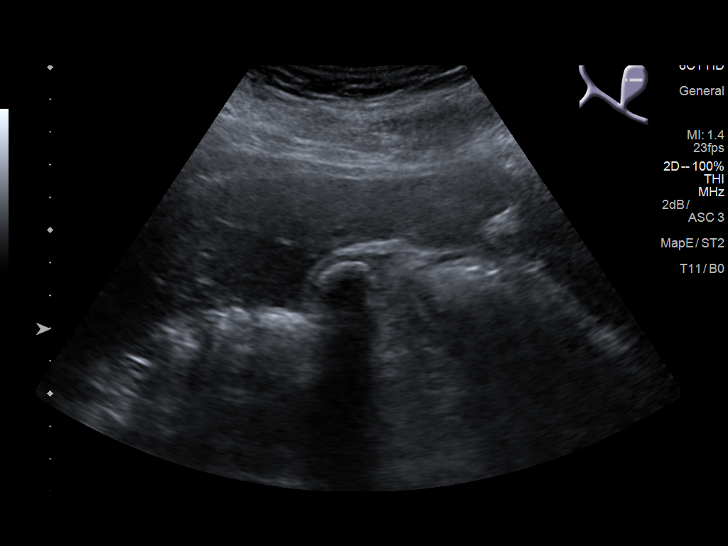
[im 26/41]
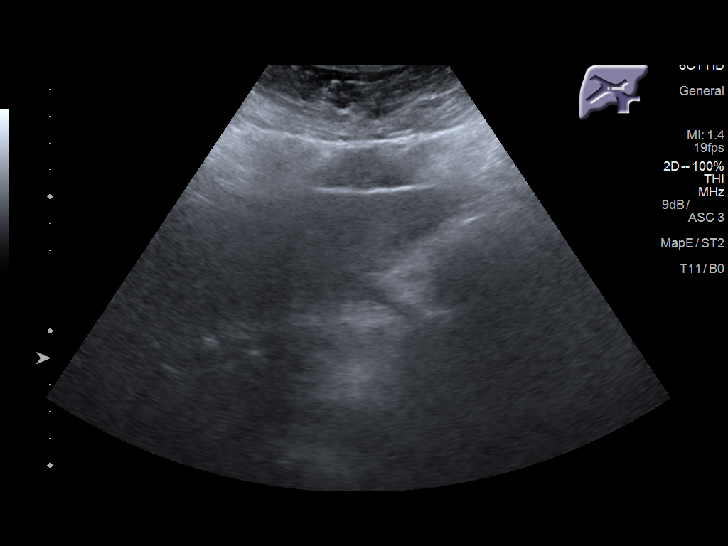
[im 27/41]
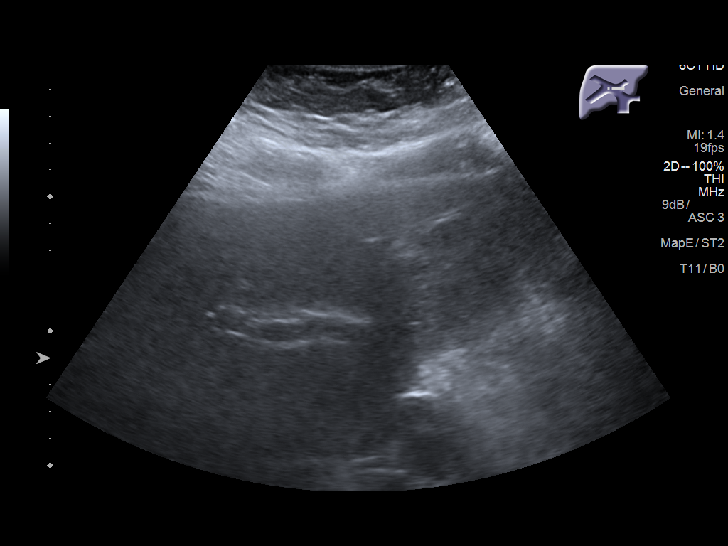
[im 31/41]
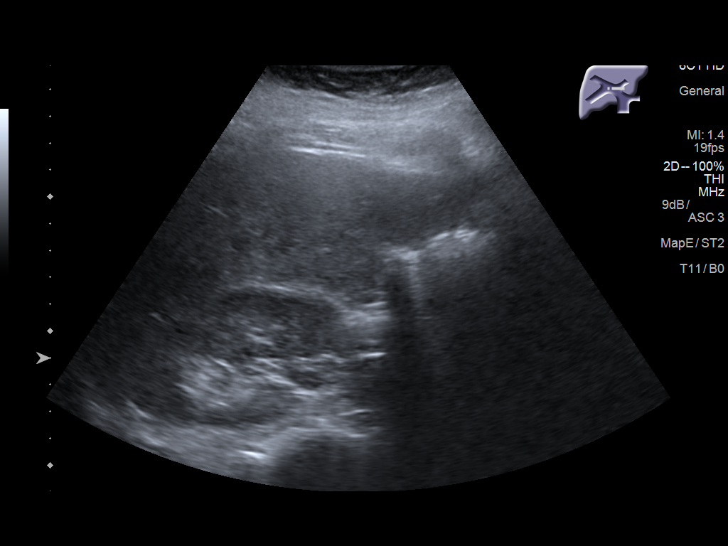
[im 34/41]
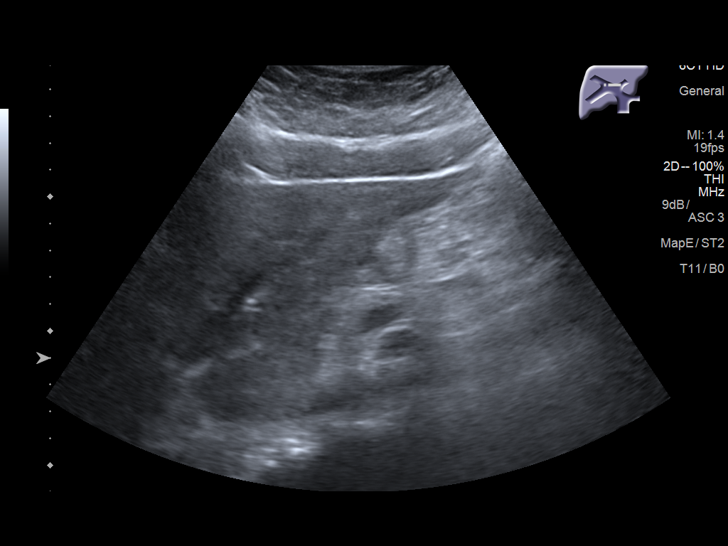
[im 37/41]
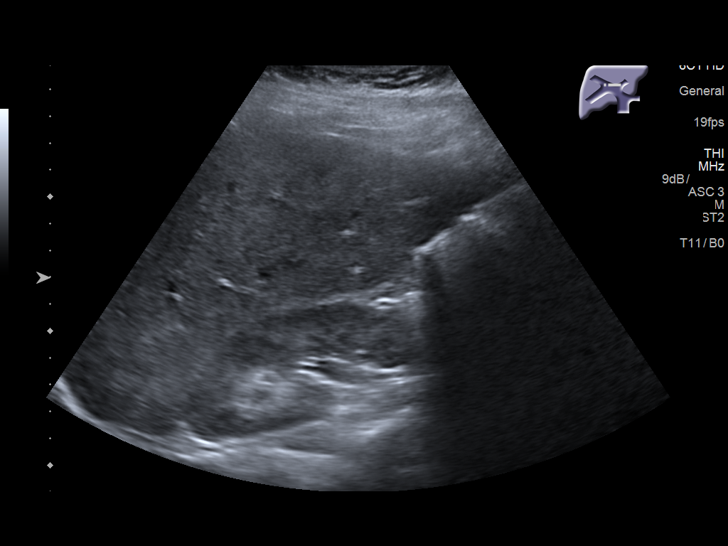
[im 41/41]
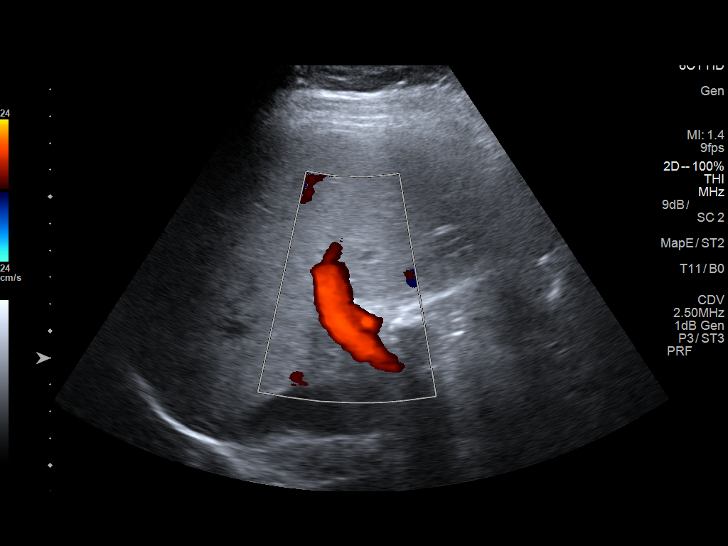

[14 of 25 positions shown; findings below may reference images not displayed]

FINDINGS: Gallbladder:

Multiple echogenic gallstones measure up to 1.5 cm, with acoustic
shadowing. No gallbladder distention. No gallbladder wall thickening
or pericholecystic fluid. No sonographic Murphy's sign elicited.

Common bile duct:

Diameter: 5 mm

Liver:

No focal lesion identified. Normal echogenicity. No intrahepatic
biliary dilatation. No perihepatic free fluid. Hepatopetal portal
vein.
IMPRESSION: Cholelithiasis without sonographic findings of acute cholecystitis.

## 2018-06-23 ENCOUNTER — Encounter: Payer: Self-pay | Admitting: Family Medicine

## 2018-06-23 ENCOUNTER — Ambulatory Visit (INDEPENDENT_AMBULATORY_CARE_PROVIDER_SITE_OTHER): Payer: 59 | Admitting: Family Medicine

## 2018-06-23 VITALS — BP 124/82 | HR 71 | Temp 98.5°F | Resp 12 | Ht 64.0 in | Wt 324.1 lb

## 2018-06-23 DIAGNOSIS — F419 Anxiety disorder, unspecified: Secondary | ICD-10-CM

## 2018-06-23 DIAGNOSIS — G4700 Insomnia, unspecified: Secondary | ICD-10-CM | POA: Diagnosis not present

## 2018-06-23 DIAGNOSIS — R197 Diarrhea, unspecified: Secondary | ICD-10-CM | POA: Diagnosis not present

## 2018-06-23 MED ORDER — DOXEPIN HCL 10 MG/ML PO CONC: 3.0000 mg | Freq: Every day | ORAL | 1 refills | Status: DC

## 2018-06-23 NOTE — Progress Notes (Signed)
HPI:   [name] is a 32 y.o. female, who is here today for 6 weeks follow up.   She was last seen on 05/09/18.  Since her last OV she has been in the ER due to headache and chest pain. Last OV she was c/o worsening anxiety,so Sertraline 25 mg was recommended. She discontinued med after a few days, she "did not like it",not sure about side effects. Wellbutrin in the past,discontinued because caused back pain   Denies depressed mood or suicidal thoughts. + Anxiety.   Still having trouble sleeping,she sleeps about 3 hours.  Chronic diarrhea, s/p cholecystectomy. Before surgery she was having diarrhea but worse after cholecystectomy. Negative for blood. Abdominal cramps,alleviated by defecation.  She is having 2 jobs now,office work and housekeeping. Frequently she has to go to the bathroom a couple times before she can leave for work. So she has been late several times,she is afraid of losing one of her jobs.   Diarrhea exacerbated by food intake. She is eating "on the go",daily fast food like hamburgers. No alleviating factors identified. Denies abnormal wt loss.  She followed with GI and did not keep follow up appt. Questran was recommended,she discontinued because it caused constipation.  She is not exercising regularly.   Review of Systems  Constitutional: Negative for activity change, appetite change, fatigue and fever.  HENT: Negative for mouth sores, nosebleeds and trouble swallowing.   Respiratory: Negative for shortness of breath and wheezing.   Cardiovascular: Negative for chest pain, palpitations and leg swelling.  Gastrointestinal: Positive for abdominal pain and diarrhea. Negative for blood in stool, nausea and vomiting.       Negative for changes in bowel habits.  Endocrine: Negative for cold intolerance and heat intolerance.  Genitourinary: Negative for decreased urine volume and hematuria.  Musculoskeletal: Negative for gait problem  and myalgias.  Allergic/Immunologic: Negative for food allergies.  Neurological: Negative for syncope, weakness and headaches.  Psychiatric/Behavioral: Positive for sleep disturbance. Negative for confusion. The patient is nervous/anxious.      No current outpatient medications on file prior to visit.   No current facility-administered medications on file prior to visit.      Past Medical History:  Diagnosis Date  . ADD (attention deficit disorder)   . Allergy   . Anemia   . Anxiety state 09/07/2015  . Dysrhythmia   . Gastritis   . GERD (gastroesophageal reflux disease)   . Headache   . Pneumonia   . Pre-diabetes    Allergies  Allergen Reactions  . Coffee Bean Extract [Coffea Arabica] Anaphylaxis  . Other Other (See Comments) and Anaphylaxis    Chocolate, Garlic, spices, citrus cause gastritis Chocolate, Garlic, spices, citrus cause gastritis, coffee (anaphylaxis)  . Lactalbumin     Other reaction(s): Other (See Comments) unknown  . Milk-Related Compounds Other (See Comments)    unknown  . Penicillins Rash    Social History   Socioeconomic History  . Marital status: Single    Spouse name: Not on file  . Number of children: Not on file  . Years of education: Not on file  . Highest education level: Not on file  Occupational History  . Not on file  Social Needs  . Financial resource strain: Not on file  . Food insecurity:    Worry: Not on file    Inability: Not on file  . Transportation needs:    Medical: Not on file    Non-medical: Not on file  Tobacco Use  . Smoking status: Current Some Day Smoker  . Smokeless tobacco: Never Used  Substance and Sexual Activity  . Alcohol use: Yes    Alcohol/week: 0.0 standard drinks    Comment: occ  . Drug use: No  . Sexual activity: Not on file  Lifestyle  . Physical activity:    Days per week: Not on file    Minutes per session: Not on file  . Stress: Not on file  Relationships  . Social connections:    Talks  on phone: Not on file    Gets together: Not on file    Attends religious service: Not on file    Active member of club or organization: Not on file    Attends meetings of clubs or organizations: Not on file    Relationship status: Not on file  Other Topics Concern  . Not on file  Social History Narrative  . Not on file    Vitals:   06/23/18 0850  BP: 124/82  Pulse: 71  Resp: 12  Temp: 98.5 F (36.9 C)  SpO2: 100%   Body mass index is 55.64 kg/m.  Wt Readings from Last 3 Encounters:  06/23/18 (!) 324 lb 2 oz (147 kg)  12/28/17 (!) 318 lb (144.2 kg)  08/13/17 (!) 323 lb 6.4 oz (146.7 kg)    Physical Exam  Nursing note and vitals reviewed. Constitutional: She is oriented to person, place, and time. She appears well-developed. No distress.  HENT:  Head: Normocephalic and atraumatic.  Mouth/Throat: Oropharynx is clear and moist and mucous membranes are normal.  Eyes: Pupils are equal, round, and reactive to light. Conjunctivae are normal.  Cardiovascular: Normal rate and regular rhythm.  No murmur heard. Pulses:      Dorsalis pedis pulses are 2+ on the right side and 2+ on the left side.  Respiratory: Effort normal and breath sounds normal. No respiratory distress.  GI: Soft. She exhibits no mass. There is no hepatomegaly. There is no abdominal tenderness.  Musculoskeletal:        General: No edema.  Lymphadenopathy:    She has no cervical adenopathy.  Neurological: She is alert and oriented to person, place, and time. She has normal strength. No cranial nerve deficit. Gait normal.  Skin: Skin is warm. No rash noted. No erythema.  Psychiatric: Her mood appears anxious.  Well groomed, good eye contact.     ASSESSMENT AND PLAN:   Ms. Valerie Green was seen today for 6 weeks follow-up.  Diagnoses and all orders for this visit:  Insomnia, unspecified type After discussion of some side effects,she agrees with trying Doxepin. Doxepin 3-6 mg at bedtime,she  can titrate dose as tolerated to 10 mg. Good sleep hygiene. F/U in  -     doxepin (SINEQUAN) 10 MG/ML solution; Take 0.3-0.6 mLs (3-6 mg total) by mouth at bedtime.  Diarrhea, unspecified type IBS-D and post cholecystectomy synd. Strongly recommend avoiding fast food and to follow low fat diet. Recommend resuming Questran but to try 1/2 of the dose. Side effects discussed. Adequate hydration. Doxepin may help. She needs to follow with GI as recommended.  Anxiety disorder, unspecified type She is not interested in pharmacologic treatment but Doxepin may help. Instructed about warning signs. Psychotherapy could be beneficial.  Morbid obesity (HCC) Gained wt since her last OV. We discussed benefits of wt loss as well as adverse effects of obesity. Consistency with healthy diet and physical activity recommended.   Return in about 3  months (around 09/21/2018).      Betty G. [place], MD  [facility]. [place] office.

## 2018-06-23 NOTE — Patient Instructions (Signed)
A few things to remember from today's visit:   Anxiety disorder, unspecified type  Diarrhea, unspecified type  Diarrhea could be aggravated by fast foods because you know how your gallbladder. Recommend stopping fast food, cooking meals at home and packing lunch. Try half the dose of the Questran.   Please be sure medication list is accurate. If a new problem present, please set up appointment sooner than planned today.

## 2018-06-27 ENCOUNTER — Encounter: Payer: Self-pay | Admitting: Family Medicine

## 2018-06-29 ENCOUNTER — Telehealth: Payer: Self-pay | Admitting: Family Medicine

## 2018-06-29 ENCOUNTER — Other Ambulatory Visit: Payer: Self-pay | Admitting: *Deleted

## 2018-06-29 DIAGNOSIS — R1030 Lower abdominal pain, unspecified: Secondary | ICD-10-CM

## 2018-06-29 NOTE — Telephone Encounter (Signed)
Copied from CRM [id number]. Topic: Quick Communication - See Telephone Encounter >> Jun 29, 2018  2:56 PM Fanny Bien wrote: CRM for notification. See Telephone encounter for: 06/29/18. Pt called and stated that she needs fmla paperwork done and cannot get seen by her GI doctor until April or May. Pt states that she could lose your job because of missed days. Pt would like to know if Dr [place] could do anything to help. Pt would like a call back from the nurse. Please advise

## 2018-06-29 NOTE — Telephone Encounter (Signed)
Spoke with patient and she stated that she is at risk of losing her job and need FMLA paperwork filled out. Patient stated that she has only missed one day in January and has been late a lot. She cannot get into to GI to see Dr. Gerilyn Pilgrim until April and she wanted to know if she could get another referral to Dr. Servando Snare in [place]. Please advise.

## 2018-06-29 NOTE — Telephone Encounter (Signed)
I can complete FMLA for days she has missed to date. Thanks, BJ

## 2018-06-30 NOTE — Telephone Encounter (Signed)
Pt is going to bring the forms by to be filled out

## 2018-06-30 NOTE — Telephone Encounter (Signed)
Left message to return call to clinic. 

## 2018-07-03 NOTE — Telephone Encounter (Signed)
Noted. Waiting on patient to bring in forms. Nothing further needed at the moment.

## 2018-07-07 ENCOUNTER — Encounter: Payer: Self-pay | Admitting: *Deleted

## 2018-07-07 ENCOUNTER — Telehealth: Payer: Self-pay | Admitting: Family Medicine

## 2018-07-07 NOTE — Telephone Encounter (Signed)
Form placed on providers desk for completion

## 2018-07-07 NOTE — Telephone Encounter (Signed)
FMLA forms to be filled out; placed in dr's folder.  Fax to: [phone]

## 2018-07-13 DIAGNOSIS — Z0279 Encounter for issue of other medical certificate: Secondary | ICD-10-CM

## 2018-07-14 NOTE — Telephone Encounter (Signed)
Form faxed to [place]

## 2018-07-16 ENCOUNTER — Encounter: Payer: Self-pay | Admitting: Family Medicine

## 2018-07-22 ENCOUNTER — Ambulatory Visit: Payer: 59 | Admitting: Gastroenterology

## 2018-07-27 ENCOUNTER — Ambulatory Visit: Payer: 59 | Admitting: Gastroenterology

## 2018-07-28 ENCOUNTER — Telehealth: Payer: 59 | Admitting: Gastroenterology

## 2018-07-28 ENCOUNTER — Other Ambulatory Visit: Payer: Self-pay

## 2018-08-15 ENCOUNTER — Other Ambulatory Visit: Payer: Self-pay | Admitting: Family Medicine

## 2018-08-15 DIAGNOSIS — G4700 Insomnia, unspecified: Secondary | ICD-10-CM

## 2018-09-29 ENCOUNTER — Encounter: Payer: Self-pay | Admitting: Family Medicine

## 2018-09-29 ENCOUNTER — Other Ambulatory Visit: Payer: Self-pay

## 2018-09-29 ENCOUNTER — Ambulatory Visit (INDEPENDENT_AMBULATORY_CARE_PROVIDER_SITE_OTHER): Payer: 59 | Admitting: Family Medicine

## 2018-09-29 DIAGNOSIS — G4700 Insomnia, unspecified: Secondary | ICD-10-CM | POA: Diagnosis not present

## 2018-09-29 DIAGNOSIS — R197 Diarrhea, unspecified: Secondary | ICD-10-CM | POA: Diagnosis not present

## 2018-09-29 DIAGNOSIS — F419 Anxiety disorder, unspecified: Secondary | ICD-10-CM

## 2018-09-29 MED ORDER — DOXEPIN HCL 10 MG/ML PO CONC: 10.0000 mg | Freq: Every day | ORAL | 1 refills | Status: DC

## 2018-09-29 NOTE — Assessment & Plan Note (Signed)
Problem improved with Doxepin 6 mg at bedtime. Tolerating medication well. Adequate hydration. No changes in current management.  Phone number to call and schedule GI appt was given.

## 2018-09-29 NOTE — Assessment & Plan Note (Signed)
Problem is well controlled with Doxepin 6 mg at bedtime,so no changes. Some side effects discussed. Good sleep hygiene. F/U in 6 months.

## 2018-09-29 NOTE — Assessment & Plan Note (Signed)
Problem also improved some with Doxepin . Continue Doxepin 6 mg daily.

## 2018-09-29 NOTE — Progress Notes (Signed)
Virtual Visit via Video Note   I connected with [name] on 09/29/18 at  9:00 AM EDT by a video enabled telemedicine application and verified that I am speaking with the correct person using two identifiers.  Location patient: home Location provider:home office Persons participating in the virtual visit: patient, provider  I discussed the limitations of evaluation and management by telemedicine and the availability of in person appointments. The patient expressed understanding and agreed to proceed.   HPI: [name] is a 33 yo female following on some chronic medical problems. She was last seen on 06/23/18.  Insomnia: She started Doxepin 3-6 mg at bedtime,it helps her sleep. Sleeping about 8-9 hours. No side effects noted.  Diarrhea: Missed calls from GI office. Doxepin 6 mg has also helped with decreasing stool frequency. Medication has also helped with urgency and abdominal cramps.  She has not noted nausea,vomiting,blood in stool or melena.  Anxiety,Doxepin also helping. Negative for depressed mood or suicidal thoughts.   ROS: See pertinent positives and negatives per HPI.  Past Medical History:  Diagnosis Date  . ADD (attention deficit disorder)   . Allergy   . Anemia   . Anxiety state 09/07/2015  . Dysrhythmia   . Gastritis   . GERD (gastroesophageal reflux disease)   . Headache   . Pneumonia   . Pre-diabetes     Past Surgical History:  Procedure Laterality Date  . LAPAROSCOPIC CHOLECYSTECTOMY SINGLE SITE WITH INTRAOPERATIVE CHOLANGIOGRAM N/A 12/22/2015   Procedure: LAPAROSCOPIC CHOLECYSTECTOMY WITH INTRAOPERATIVE CHOLANGIOGRAM;  Surgeon: Ovidio Kinavid Newman, MD;  [facility];  Service: General;  Laterality: N/A;    Family History  Problem Relation Age of Onset  . Hyperlipidemia Brother   . Diabetes Paternal Uncle   . Colon cancer Neg Hx   . Esophageal cancer Neg Hx   . Pancreatic cancer Neg Hx   . Stomach cancer Neg Hx   . Liver disease Neg Hx      Social History   Socioeconomic History  . Marital status: Single    Spouse name: Not on file  . Number of children: Not on file  . Years of education: Not on file  . Highest education level: Not on file  Occupational History  . Not on file  Social Needs  . Financial resource strain: Not on file  . Food insecurity:    Worry: Not on file    Inability: Not on file  . Transportation needs:    Medical: Not on file    Non-medical: Not on file  Tobacco Use  . Smoking status: Current Some Day Smoker  . Smokeless tobacco: Never Used  Substance and Sexual Activity  . Alcohol use: Yes    Alcohol/week: 0.0 standard drinks    Comment: occ  . Drug use: No  . Sexual activity: Not on file  Lifestyle  . Physical activity:    Days per week: Not on file    Minutes per session: Not on file  . Stress: Not on file  Relationships  . Social connections:    Talks on phone: Not on file    Gets together: Not on file    Attends religious service: Not on file    Active member of club or organization: Not on file    Attends meetings of clubs or organizations: Not on file    Relationship status: Not on file  . Intimate partner violence:    Fear of current or ex partner: Not on file  Emotionally abused: Not on file    Physically abused: Not on file    Forced sexual activity: Not on file  Other Topics Concern  . Not on file  Social History Narrative  . Not on file      Current Outpatient Medications:  .  doxepin (SINEQUAN) 10 MG/ML solution, Take 1 mL (10 mg total) by mouth at bedtime., Disp: 90 mL, Rfl: 1  EXAM:  VITALS per patient if applicable:N/A GENERAL: alert, oriented, appears well and in no acute distress  HEENT: atraumatic,normocephalic, and conjunctiva clear.  LUNGS: on inspection no signs of respiratory distress, breathing rate appears normal, no obvious gross SOB, gasping or wheezing  CV: no obvious cyanosis  PSYCH/NEURO: pleasant and cooperative, no obvious  depression or anxiety, speech and thought processing grossly intact  ASSESSMENT AND PLAN:  Discussed the following assessment and plan:  Insomnia Problem is well controlled with Doxepin 6 mg at bedtime,so no changes. Some side effects discussed. Good sleep hygiene. F/U in 6 months.  Diarrhea Problem improved with Doxepin 6 mg at bedtime. Tolerating medication well. Adequate hydration. No changes in current management.  Phone number to call and schedule GI appt was given.  Anxiety disorder Problem also improved some with Doxepin . Continue Doxepin 6 mg daily.      I discussed the assessment and treatment plan with the patient. She was provided an opportunity to ask questions and all were answered. The patient agreed with the plan and demonstrated an understanding of the instructions.   The patient was advised to call back or seek an in-person evaluation if the symptoms worsen or if the condition fails to improve as anticipated.  Return in about 6 months (around 04/01/2019) for cpe.    Valerie Swazil[place], MD

## 2018-11-11 ENCOUNTER — Telehealth: Payer: Self-pay | Admitting: Gastroenterology

## 2018-11-11 NOTE — Telephone Encounter (Signed)
That would be fine 

## 2018-11-11 NOTE — Telephone Encounter (Signed)
Patient called & wanted to know if Dr Allen Norris would re-consider seeing her as a patient. She was seem by him in 2017 & had forgotten this when her PCP suggested another MD. Dr Allen Norris has said since she had seen another doctor he would not see her. She remembers how caring & genuine he was during her care.

## 2018-11-11 NOTE — Telephone Encounter (Signed)
Please contact pt and schedule an appt with Dr. Allen Norris. He has ok'd for her to come back to our office. Just next available.

## 2018-12-02 ENCOUNTER — Encounter: Payer: Self-pay | Admitting: Family Medicine

## 2018-12-03 ENCOUNTER — Other Ambulatory Visit: Payer: Self-pay

## 2018-12-03 ENCOUNTER — Ambulatory Visit (HOSPITAL_COMMUNITY)
Admission: EM | Admit: 2018-12-03 | Discharge: 2018-12-03 | Disposition: A | Discharge status: Home / Self Care | Payer: 59 | Attending: Internal Medicine | Admitting: Internal Medicine

## 2018-12-03 ENCOUNTER — Encounter (HOSPITAL_COMMUNITY): Payer: Self-pay

## 2018-12-03 ENCOUNTER — Ambulatory Visit: Payer: Self-pay | Admitting: *Deleted

## 2018-12-03 DIAGNOSIS — R1013 Epigastric pain: Secondary | ICD-10-CM | POA: Diagnosis not present

## 2018-12-03 DIAGNOSIS — R0789 Other chest pain: Secondary | ICD-10-CM | POA: Diagnosis not present

## 2018-12-03 DIAGNOSIS — K219 Gastro-esophageal reflux disease without esophagitis: Secondary | ICD-10-CM | POA: Diagnosis not present

## 2018-12-03 MED ORDER — PANTOPRAZOLE SODIUM 20 MG PO TBEC: 20.0000 mg | DELAYED_RELEASE_TABLET | Freq: Every day | ORAL | 0 refills | Status: DC

## 2018-12-03 NOTE — Telephone Encounter (Signed)
Pt evaluated and discharged [facility].

## 2018-12-03 NOTE — ED Triage Notes (Signed)
Pt presents with central chest pain/tightness since Saturday not associated with any particuliar illness or event.

## 2018-12-03 NOTE — Telephone Encounter (Signed)
Will monitor chart for ED arrival.  

## 2018-12-03 NOTE — ED Provider Notes (Signed)
[facility]    CSN: [id number] Arrival date & time: 12/03/18  1359     History   Chief Complaint Chief Complaint  Patient presents with  . Chest Pain    HPI Valerie Green is a 33 y.o. female who recently tested negative for COVID-19 infection a few days ago comes to urgent care with complaints of mid chest pain and epigastric pain of a few days duration.  Patient has a history of gastroesophageal reflux disease.  Pain has been persistent over the past few days.  It is nonradiating.  Worse at night when she lays down to sleep and she has to prop herself up sometimes.  No diaphoresis.  Increased belching.  No abdominal distention.  No fever or chills.  Patient has no family history of early cardiac death/heart disease.   HPI  Past Medical History:  Diagnosis Date  . ADD (attention deficit disorder)   . Allergy   . Anemia   . Anxiety state 09/07/2015  . Dysrhythmia   . Gastritis   . GERD (gastroesophageal reflux disease)   . Headache   . Pneumonia   . Pre-diabetes     Patient Active Problem List   Diagnosis Date Noted  . Diarrhea 06/23/2018  . Obstructive sleep apnea 02/23/2016  . Insomnia 01/19/2016  . Morbid obesity ([place]) 01/19/2016  . S/P laparoscopic cholecystectomy August 2017 12/23/2015  . Cholecystitis 12/22/2015  . Anxiety disorder 09/07/2015    Past Surgical History:  Procedure Laterality Date  . LAPAROSCOPIC CHOLECYSTECTOMY SINGLE SITE WITH INTRAOPERATIVE CHOLANGIOGRAM N/A 12/22/2015   Procedure: LAPAROSCOPIC CHOLECYSTECTOMY WITH INTRAOPERATIVE CHOLANGIOGRAM;  Surgeon: Alphonsa Overall, MD;  Location: [facility];  Service: General;  Laterality: N/A;    OB History    Gravida  1   Para      Term      Preterm      AB      Living        SAB      TAB      Ectopic      Multiple      Live Births               Home Medications    Prior to Admission medications   Medication Sig Start Date End Date Taking? Authorizing Provider   doxepin (SINEQUAN) 10 MG/ML solution Take 1 mL (10 mg total) by mouth at bedtime. 09/29/18   Martinique, Betty G, MD  pantoprazole (PROTONIX) 20 MG tablet Take 1 tablet (20 mg total) by mouth daily. 12/03/18 01/02/19  Chase Picket, MD    Family History Family History  Problem Relation Age of Onset  . Hyperlipidemia Brother   . Diabetes Paternal Uncle   . Colon cancer Neg Hx   . Esophageal cancer Neg Hx   . Pancreatic cancer Neg Hx   . Stomach cancer Neg Hx   . Liver disease Neg Hx     Social History Social History   Tobacco Use  . Smoking status: Current Some Day Smoker  . Smokeless tobacco: Never Used  Substance Use Topics  . Alcohol use: Yes    Alcohol/week: 0.0 standard drinks    Comment: occ  . Drug use: No     Allergies   Coffee bean extract [coffea arabica], Other, Lactalbumin, Milk-related compounds, and Penicillins   Review of Systems Review of Systems  Constitutional: Negative for activity change, chills, diaphoresis, fatigue and fever.  HENT: Negative.   Respiratory: Positive for chest tightness.  Negative for cough, shortness of breath and wheezing.   Cardiovascular: Positive for chest pain. Negative for palpitations.  Gastrointestinal: Positive for abdominal pain and nausea. Negative for diarrhea and vomiting.  Endocrine: Negative.   Genitourinary: Negative.   Musculoskeletal: Negative.   Neurological: Negative for dizziness, weakness, light-headedness and numbness.     Physical Exam Triage Vital Signs ED Triage Vitals [12/03/18 1457]  Enc Vitals Group     BP 128/88     Pulse Rate 67     Resp 17     Temp 98.9 F (37.2 C)     Temp Source Oral     SpO2 100 %     Weight      Height      Head Circumference      Peak Flow      Pain Score 6     Pain Loc      Pain Edu?      Excl. in GC?    No data found.  Updated Vital Signs BP 128/88 (BP Location: Left Arm)   Pulse 67   Temp 98.9 F (37.2 C) (Oral)   Resp 17   SpO2 100%   Visual  Acuity Right Eye Distance:   Left Eye Distance:   Bilateral Distance:    Right Eye Near:   Left Eye Near:    Bilateral Near:     Physical Exam Constitutional:      Appearance: She is obese. She is not ill-appearing or toxic-appearing.  Cardiovascular:     Rate and Rhythm: Normal rate and regular rhythm.  No extrasystoles are present.    Heart sounds: Normal heart sounds.  Pulmonary:     Effort: Pulmonary effort is normal. No tachypnea or respiratory distress.     Breath sounds: Normal breath sounds. No decreased breath sounds, wheezing or rhonchi.  Chest:     Chest wall: No mass, tenderness or edema.  Abdominal:     General: There is no abdominal bruit.     Palpations: Abdomen is soft. There is no hepatomegaly.  Skin:    General: Skin is warm.     Capillary Refill: Capillary refill takes less than 2 seconds.  Neurological:     General: No focal deficit present.     Mental Status: She is alert.      UC Treatments / Results  Labs (all labs ordered are listed, but only abnormal results are displayed) Labs Reviewed - No data to display  EKG   Radiology No results found.  Procedures Procedures (including critical care time)  Medications Ordered in UC Medications - No data to display  Initial Impression / Assessment and Plan / UC Course  I have reviewed the triage vital signs and the nursing notes.  Pertinent labs & imaging results that were available during my care of the patient were reviewed by me and considered in my medical decision making (see chart for details).     1.  Lower chest/epigastric pain likely gastroesophageal reflux disease with esophagitis: EKG is unchanged from previously.  EKG shows an isolated T wave inversion in lead V1. Patient's heart score is less than 3 has low risk for cardiac event We will start patient on Protonix 20 mg daily If no improvement after a month, patient may need gastroenterology referral for EGD. Patient is advised to  return to urgent care if symptoms does not improve or if her symptoms worsen. Final Clinical Impressions(s) / UC Diagnoses   Final diagnoses:  Gastroesophageal reflux  disease, esophagitis presence not specified   Discharge Instructions   None    ED Prescriptions    Medication Sig Dispense Auth. Provider   pantoprazole (PROTONIX) 20 MG tablet Take 1 tablet (20 mg total) by mouth daily. 30 tablet Krishna Heuer, Britta MccreedyPhilip O, MD     Controlled Substance Prescriptions [place] Controlled Substance Registry consulted? No   Merrilee JanskyLamptey, Kevionna Heffler O, MD 12/03/18 [phone]

## 2018-12-03 NOTE — Telephone Encounter (Signed)
7/17- nausea, body weakness- patient was tested for COVID. Patient started having pain in chest that started Saturday evening-painful when she moves- tightness and pain in middle of chest. Patient states she has SOB and wheezing that last 2-3 days and is not present now.Patient states she has no pain with deep breath. COVID results were negative per patient. Patient works in [employer] and has been out of work due to her symptoms. Call to office- due to patient symptoms and current pain levels- patient advised to go to UC/ED for evaluation of her symptoms. Patient advised and she will go.   Reason for Disposition . [1] Chest pain lasts > 5 minutes AND [2] occurred in past 3 days (72 hours)  Answer Assessment - Initial Assessment Questions 1. LOCATION: "Where does it hurt?"       Under breast cavity- middle sternum 2. RADIATION: "Does the pain go anywhere else?" (e.g., into neck, jaw, arms, back)     Not traveling 3. ONSET: "When did the chest pain begin?" (Minutes, hours or days)      Saturday - consistent- more intense at times 4. PATTERN "Does the pain come and go, or has it been constant since it started?"  "Does it get worse with exertion?"      Constant dull tightening pain 5. DURATION: "How long does it last" (e.g., seconds, minutes, hours)     Intense pain can last 30 minutes 6. SEVERITY: "How bad is the pain?"  (e.g., Scale 1-10; mild, moderate, or severe)    - MILD (1-3): doesn't interfere with normal activities     - MODERATE (4-7): interferes with normal activities or awakens from sleep    - SEVERE (8-10): excruciating pain, unable to do any normal activities       intense pain- 6, constant- 2 7. CARDIAC RISK FACTORS: "Do you have any history of heart problems or risk factors for heart disease?" (e.g., angina, prior heart attack; diabetes, high blood pressure, high cholesterol, smoker, or strong family history of heart disease)     Family history 8. PULMONARY RISK  FACTORS: "Do you have any history of lung disease?"  (e.g., blood clots in lung, asthma, emphysema, birth control pills)     no 9. CAUSE: "What do you think is causing the chest pain?"     anxiety 10. OTHER SYMPTOMS: "Do you have any other symptoms?" (e.g., dizziness, nausea, vomiting, sweating, fever, difficulty breathing, cough)       At first- nausea, eye pain, weakness,SOB,wheezing- Saturday lasted until Monday 11. PREGNANCY: "Is there any chance you are pregnant?" "When was your last menstrual period?"       No, LMP- irregular cycle- not active  Protocols used: CHEST PAIN-A-AH

## 2018-12-16 ENCOUNTER — Encounter: Payer: Self-pay | Admitting: Gastroenterology

## 2018-12-16 ENCOUNTER — Ambulatory Visit (INDEPENDENT_AMBULATORY_CARE_PROVIDER_SITE_OTHER): Payer: 59 | Admitting: Gastroenterology

## 2018-12-16 ENCOUNTER — Other Ambulatory Visit: Payer: Self-pay

## 2018-12-16 DIAGNOSIS — K580 Irritable bowel syndrome with diarrhea: Secondary | ICD-10-CM

## 2018-12-16 MED ORDER — DICYCLOMINE HCL 20 MG PO TABS: 20.0000 mg | ORAL_TABLET | Freq: Three times a day (TID) | ORAL | 1 refills | Status: DC

## 2018-12-16 MED ORDER — CHOLESTYRAMINE 4 G PO PACK: 4.0000 g | PACK | Freq: Four times a day (QID) | ORAL | 2 refills | Status: DC

## 2018-12-16 NOTE — Progress Notes (Signed)
Valerie Lame, MD [address]  [place]  [place], [place] [address]  Main: [phone]  Fax: [phone]    Gastroenterology Virtual/Video Visit  Referring Provider:     Martinique, Betty G, MD Primary Care Physician:  Martinique, Betty G, MD Primary Gastroenterologist:  Dr.Oletha Tolson Allen Norris Reason for Consultation:     Lower abdominal pain        HPI:    Virtual Visit via Video Note Location of the patient: Home Location of provider: Office  Participating persons: The patient myself and Valerie Green.  I connected with Cathleen Fears Michalsky on 12/16/18 at  8:30 AM EDT by a video enabled telemedicine application and verified that I am speaking with the correct person using two identifiers.   I discussed the limitations of evaluation and management by telemedicine and the availability of in person appointments. The patient expressed understanding and agreed to proceed.  Verbal consent to proceed obtained.  History of Present Illness: Valerie Green is a 33 y.o. female referred by Dr. Martinique, Betty G, MD  for consultation & management of abdominal pain.  The patient had seen me back in May 2017 with alternating diarrhea constipation and abdominal pain.  The patient was found to have gallstones but no signs of gallbladder disease.  Subsequently the patient had her gallbladder out and did not follow-up with me.  When she was seen by me she was taking a PPI intermittently and I tried her on Dexilant 60 mg/day to be taken every day.  The patient then followed up with Dr. Oretha Caprice on April 10 of 2019.  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  The patient was reported to have been asked to follow-up in 3 months but did not follow-up at that time and now contact my office to come back to follow-up with me. The patient continues to  deny any worry symptoms such as bloody stools unexplained weight loss or family history of colon cancer or colon polyps.  She also reports that her heartburn is under control and her main issue now is diarrhea.  She also reports that the constipation is not an issue.  She did not start the Questran she was recommended to start by Dr. Ardis Hughs because she states that she was concerned it may cause her constipation.   Past Medical History:  Diagnosis Date  . ADD (attention deficit disorder)   . Allergy   . Anemia   . Anxiety state 09/07/2015  . Dysrhythmia   . Gastritis   . GERD (gastroesophageal reflux disease)   . Headache   . Pneumonia   . Pre-diabetes     Past Surgical History:  Procedure Laterality Date  . LAPAROSCOPIC CHOLECYSTECTOMY SINGLE SITE WITH INTRAOPERATIVE CHOLANGIOGRAM N/A 12/22/2015   Procedure: LAPAROSCOPIC CHOLECYSTECTOMY WITH INTRAOPERATIVE CHOLANGIOGRAM;  Surgeon: Alphonsa Overall, MD;  Location: [facility];  Service: General;  Laterality: N/A;    Prior to Admission medications   Medication Sig Start Date End Date Taking? Authorizing Provider  doxepin (SINEQUAN) 10 MG/ML solution Take 1 mL (10 mg total) by mouth at bedtime. 09/29/18   Martinique, Betty G, MD  pantoprazole (PROTONIX) 20 MG tablet Take 1 tablet (20 mg total) by mouth daily. 12/03/18 01/02/19  Chase Picket, MD    Family History  Problem Relation Age of Onset  . Hyperlipidemia Brother   .  Diabetes Paternal Uncle   . Colon cancer Neg Hx   . Esophageal cancer Neg Hx   . Pancreatic cancer Neg Hx   . Stomach cancer Neg Hx   . Liver disease Neg Hx      Social History   Tobacco Use  . Smoking status: Current Some Day Smoker  . Smokeless tobacco: Never Used  Substance Use Topics  . Alcohol use: Yes    Alcohol/week: 0.0 standard drinks    Comment: occ  . Drug use: No    Allergies as of 12/16/2018 - Review Complete 12/03/2018  Allergen Reaction Noted  . Coffee bean extract [coffea arabica] Anaphylaxis  12/18/2015  . Other Other (See Comments) and Anaphylaxis 11/11/2015  . Lactalbumin  11/04/2014  . Milk-related compounds Other (See Comments) 11/04/2014  . Penicillins Rash 08/24/2012    Review of Systems:    All systems reviewed and negative except where noted in HPI.   Observations/Objective:  Labs: CBC    Component Value Date/Time   WBC 7.1 12/28/2017 2354   RBC 3.98 12/28/2017 2354   HGB 12.4 12/28/2017 2354   HCT 34.5 (L) 12/28/2017 2354   PLT 207 12/28/2017 2354   MCV 86.6 12/28/2017 2354   MCH 31.0 12/28/2017 2354   MCHC 35.8 12/28/2017 2354   RDW 13.3 12/28/2017 2354   LYMPHSABS 3.2 12/19/2015 1515   MONOABS 0.3 12/19/2015 1515   EOSABS 0.1 12/19/2015 1515   BASOSABS 0.0 12/19/2015 1515   CMP     Component Value Date/Time   NA 137 12/28/2017 2354   NA 137 12/22/2015   K 3.4 (L) 12/28/2017 2354   CL 108 12/28/2017 2354   CO2 26 12/28/2017 2354   GLUCOSE 125 (H) 12/28/2017 2354   BUN 10 12/28/2017 2354   BUN 8 12/22/2015   CREATININE 0.69 12/28/2017 2354   CALCIUM 8.4 (L) 12/28/2017 2354   PROT 7.3 12/19/2015 1515   ALBUMIN 3.7 12/19/2015 1515   AST 26 12/22/2015   ALT 17 12/22/2015   ALKPHOS 42 12/19/2015 1515   BILITOT 0.3 12/19/2015 1515   GFRNONAA >60 12/28/2017 2354   GFRAA >60 12/28/2017 2354    Imaging Studies: No results found.  Assessment and Plan:   Prescilla SoursRashada Chantal Mattix is a 33 y.o. y/o female has been referred for continued abdominal pain.  The patient has been diagnosed in the past with irritable bowel syndrome.  The patient had not started Questran as recommended by her previous gastroenterologist because she was worried that it may cause constipation.  The patient has been told that it binds the bile and will only cause constipation if she is naturally constipated but should help with the diarrhea.  The patient also reports that her urgency to run to the bathroom after eating it is a big problem therefore she will be started on  dicyclomine 3 times a day.  The patient has been explained the plan and agrees with it.  Follow Up Instructions:  I discussed the assessment and treatment plan with the patient. The patient was provided an opportunity to ask questions and all were answered. The patient agreed with the plan and demonstrated an understanding of the instructions.   The patient was advised to call back or seek an in-person evaluation if the symptoms worsen or if the condition fails to improve as anticipated.  I provided 35 minutes of non-face-to-face time during this encounter.   Midge Miniumarren Erie Sica, MD  Speech recognition software was used to dictate the above note.

## 2019-02-12 ENCOUNTER — Other Ambulatory Visit: Payer: Self-pay

## 2019-02-12 MED ORDER — DICYCLOMINE HCL 20 MG PO TABS: 20.0000 mg | ORAL_TABLET | Freq: Three times a day (TID) | ORAL | 3 refills | Status: DC

## 2019-03-14 ENCOUNTER — Other Ambulatory Visit: Payer: Self-pay | Admitting: Family Medicine

## 2019-03-14 DIAGNOSIS — G4700 Insomnia, unspecified: Secondary | ICD-10-CM

## 2019-03-15 NOTE — Telephone Encounter (Signed)
Forwarding to PCP for approval  

## 2019-06-02 ENCOUNTER — Encounter: Payer: Self-pay | Admitting: Family Medicine

## 2019-06-02 ENCOUNTER — Telehealth (INDEPENDENT_AMBULATORY_CARE_PROVIDER_SITE_OTHER): Payer: 59 | Admitting: Family Medicine

## 2019-06-02 VITALS — Ht 64.0 in

## 2019-06-02 DIAGNOSIS — L219 Seborrheic dermatitis, unspecified: Secondary | ICD-10-CM | POA: Diagnosis not present

## 2019-06-02 MED ORDER — KETOCONAZOLE 2 % EX SHAM: 1.0000 "application " | MEDICATED_SHAMPOO | CUTANEOUS | 2 refills | Status: DC

## 2019-06-02 MED ORDER — TRIAMCINOLONE ACETONIDE 0.1 % EX CREA: 1.0000 "application " | TOPICAL_CREAM | Freq: Two times a day (BID) | CUTANEOUS | 0 refills | Status: DC

## 2019-06-02 NOTE — Progress Notes (Signed)
Virtual Visit via Video Note   I connected with Valerie Green on 06/02/19 by a video enabled telemedicine application and verified that I am speaking with the correct person using two identifiers.  Location patient: home Location provider:work office Persons participating in the virtual visit: patient, provider  I discussed the limitations of evaluation and management by telemedicine and the availability of in person appointments. The patient expressed understanding and agreed to proceed.  Chief Complaint  Patient presents with  . Rash    around neck towards the scalp    HPI: Valerie Green is a 34 year old female with history of allergies, anxiety, and insomnia complaining of 3 weeks of pruritic, nonerythematous skin rash that started with small patch on post aspect of neck and spread to occipital scalp. Some flaking noted on scalp. Problem is otherwise stable.  Negative for new medication, detergent, or body product.  She has tried different soaps, looking for a "more natural" option,one in particular exacerbate itching, she is back to her plain DOVE. She has not identified exacerbating factors.  No known insect bite or outdoor exposures to plants. No sick contact. No similar rash in the past. Hx of eczema. OTC medication for this problem: Topical Benadryl helps.  Negative for oral lesions/edema,cough, wheezing, dyspnea, abdominal pain, nausea, or vomiting.  ROS: See pertinent positives and negatives per HPI.  Past Medical History:  Diagnosis Date  . ADD (attention deficit disorder)   . Allergy   . Anemia   . Anxiety state 09/07/2015  . Dysrhythmia   . Gastritis   . GERD (gastroesophageal reflux disease)   . Headache   . Pneumonia   . Pre-diabetes     Past Surgical History:  Procedure Laterality Date  . LAPAROSCOPIC CHOLECYSTECTOMY SINGLE SITE WITH INTRAOPERATIVE CHOLANGIOGRAM N/A 12/22/2015   Procedure: LAPAROSCOPIC CHOLECYSTECTOMY WITH INTRAOPERATIVE CHOLANGIOGRAM;   Surgeon: Ovidio Kin, MD;  Location: [facility] OR;  Service: General;  Laterality: N/A;    Family History  Problem Relation Age of Onset  . Hyperlipidemia Brother   . Diabetes Paternal Uncle   . Colon cancer Neg Hx   . Esophageal cancer Neg Hx   . Pancreatic cancer Neg Hx   . Stomach cancer Neg Hx   . Liver disease Neg Hx     Social History   Socioeconomic History  . Marital status: Single    Spouse name: Not on file  . Number of children: Not on file  . Years of education: Not on file  . Highest education level: Not on file  Occupational History  . Not on file  Tobacco Use  . Smoking status: Current Some Day Smoker  . Smokeless tobacco: Never Used  Substance and Sexual Activity  . Alcohol use: Yes    Alcohol/week: 0.0 standard drinks    Comment: occ  . Drug use: No  . Sexual activity: Not on file  Other Topics Concern  . Not on file  Social History Narrative  . Not on file   Social Determinants of Health   Financial Resource Strain:   . Difficulty of Paying Living Expenses: Not on file  Food Insecurity:   . Worried About Programme researcher, broadcasting/film/video in the Last Year: Not on file  . Ran Out of Food in the Last Year: Not on file  Transportation Needs:   . Lack of Transportation (Medical): Not on file  . Lack of Transportation (Non-Medical): Not on file  Physical Activity:   . Days of Exercise per Week: Not on file  .  Minutes of Exercise per Session: Not on file  Stress:   . Feeling of Stress : Not on file  Social Connections:   . Frequency of Communication with Friends and Family: Not on file  . Frequency of Social Gatherings with Friends and Family: Not on file  . Attends Religious Services: Not on file  . Active Member of Clubs or Organizations: Not on file  . Attends Archivist Meetings: Not on file  . Marital Status: Not on file  Intimate Partner Violence:   . Fear of Current or Ex-Partner: Not on file  . Emotionally Abused: Not on file  . Physically  Abused: Not on file  . Sexually Abused: Not on file    Current Outpatient Medications:  .  doxepin (SINEQUAN) 10 MG/ML solution, TAKE 1 ML(10 MG) BY MOUTH AT BEDTIME, Disp: 90 mL, Rfl: 0 .  [START ON 06/03/2019] ketoconazole (NIZORAL) 2 % shampoo, Apply 1 application topically 2 (two) times a week., Disp: 120 mL, Rfl: 2 .  triamcinolone cream (KENALOG) 0.1 %, Apply 1 application topically 2 (two) times daily., Disp: 45 g, Rfl: 0  EXAM:  VITALS per patient if applicable:Ht 5\' 4"  (1.626 m)   BMI 55.64 kg/m   GENERAL: alert, oriented, appears well and in no acute distress  HEENT: atraumatic, conjunctiva clear, no obvious abnormalities on inspection.  NECK: normal movements of the head and neck.  LUNGS: on inspection no signs of respiratory distress, breathing rate appears normal, no obvious gross SOB, gasping or wheezing  CV: no obvious cyanosis  SKIN: Hyperpigmented areas around her neck, I cannot appreciate rash through video.  There is no erythema or skin ulcers.  PSYCH/NEURO: pleasant and cooperative, no obvious depression or anxiety, speech and thought processing grossly intact  ASSESSMENT AND PLAN:  Discussed the following assessment and plan:  Seborrheic dermatitis, unspecified - Plan: triamcinolone cream (KENALOG) 0.1 %, ketoconazole (NIZORAL) 2 % shampoo  History suggest seborrheic dermatitis. We discussed other possible etiologies. Topical triamcinolone twice daily for up to 14 days at the time recommended, we discussed some side effects of chronic steroid use. Ketoconazole shampoo daily as needed.  F/U as needed.  I discussed the assessment and treatment plan with the patient.  Valerie Green was provided an opportunity to ask questions and all were answered.  She agreed with the plan and demonstrated an understanding of the instructions.    Return if symptoms worsen or fail to improve.    Mallori Araque Martinique, MD

## 2019-08-04 ENCOUNTER — Encounter: Payer: Self-pay | Admitting: Family Medicine

## 2019-08-04 NOTE — Telephone Encounter (Signed)
Ok for patient to bring fmla papers for stomach problems?

## 2019-09-06 ENCOUNTER — Ambulatory Visit (INDEPENDENT_AMBULATORY_CARE_PROVIDER_SITE_OTHER): Payer: 59 | Admitting: Gastroenterology

## 2019-09-06 ENCOUNTER — Other Ambulatory Visit: Payer: Self-pay

## 2019-09-06 ENCOUNTER — Encounter: Payer: Self-pay | Admitting: Gastroenterology

## 2019-09-06 VITALS — Temp 97.8°F | Ht 64.0 in | Wt 358.2 lb

## 2019-09-06 DIAGNOSIS — K580 Irritable bowel syndrome with diarrhea: Secondary | ICD-10-CM

## 2019-09-06 NOTE — Progress Notes (Signed)
    Primary Care Physician: [place], Betty G, MD  Primary Gastroenterologist:  Dr. Midge Minium  Chief Complaint  Patient presents with  . Follow up IBS with diarrhea    HPI: Valerie Green is a 34 y.o. female here for follow-up and to discuss FMLA papers.  The patient reports that she had FMLA papers sent off by her primary care provider last year for her irritable bowel syndrome.  The patient states that 1 day to 2 days of months she is usually an hour late for work due to having to go to the bathroom in the morning.  The patient now reports that she is at risk of losing her job because she does not have any updated FMLA papers.  Past Medical History:  Diagnosis Date  . ADD (attention deficit disorder)   . Allergy   . Anemia   . Anxiety state 09/07/2015  . Dysrhythmia   . Gastritis   . GERD (gastroesophageal reflux disease)   . Headache   . Pneumonia   . Pre-diabetes     Current Outpatient Medications  Medication Sig Dispense Refill  . doxepin (SINEQUAN) 10 MG/ML solution TAKE 1 ML(10 MG) BY MOUTH AT BEDTIME 90 mL 0  . ketoconazole (NIZORAL) 2 % shampoo Apply 1 application topically 2 (two) times a week. 120 mL 2  . naproxen (NAPROSYN) 500 MG tablet Take 500 mg by mouth 2 (two) times daily.    Marland Kitchen triamcinolone cream (KENALOG) 0.1 % Apply 1 application topically 2 (two) times daily. 45 g 0   No current facility-administered medications for this visit.    Allergies as of 09/06/2019 - Review Complete 09/06/2019  Allergen Reaction Noted  . Coffee bean extract [coffea arabica] Anaphylaxis 12/18/2015  . Other Other (See Comments) and Anaphylaxis 11/11/2015  . Lactalbumin  11/04/2014  . Milk-related compounds Other (See Comments) 11/04/2014  . Penicillins Rash 08/24/2012    ROS:  General: Negative for anorexia, weight loss, fever, chills, fatigue, weakness. ENT: Negative for hoarseness, difficulty swallowing , nasal congestion. CV: Negative for chest pain, angina,  palpitations, dyspnea on exertion, peripheral edema.  Respiratory: Negative for dyspnea at rest, dyspnea on exertion, cough, sputum, wheezing.  GI: See history of present illness. GU:  Negative for dysuria, hematuria, urinary incontinence, urinary frequency, nocturnal urination.  Endo: Negative for unusual weight change.    Physical Examination:   Temp 97.8 F (36.6 C) (Temporal)   Ht 5\' 4"  (1.626 m)   Wt (!) 358 lb 3.2 oz (162.5 kg)   BMI 61.48 kg/m   General: Well-nourished, well-developed in no acute distress.  Eyes: No icterus. Conjunctivae pink. Lungs: Clear to auscultation bilaterally. Non-labored. Neuro: Alert and oriented x 3.  Grossly intact. Skin: no jaundice.   Psych: Alert and cooperative, normal mood and affect.  Labs:    Imaging Studies: No results found.  Assessment and Plan:   Valerie Green is a 34 y.o. y/o female who comes in and states that her irritable bowel syndrome is stable without any worsening of her symptoms.  She is presently just managing with diet.  The patient will have her FMLA papers filled out.  The patient has been explained the plan agrees with it.     38, MD. Midge Minium    Note: This dictation was prepared with Dragon dictation along with smaller phrase technology. Any transcriptional errors that result from this process are unintentional.

## 2020-01-18 ENCOUNTER — Other Ambulatory Visit: Payer: Self-pay

## 2020-01-19 ENCOUNTER — Other Ambulatory Visit: Payer: Self-pay

## 2020-01-19 MED ORDER — CHOLESTYRAMINE 4 G PO PACK: 4.0000 g | PACK | Freq: Four times a day (QID) | ORAL | 3 refills | Status: DC

## 2020-11-25 ENCOUNTER — Emergency Department (HOSPITAL_COMMUNITY): Payer: 59

## 2020-11-25 ENCOUNTER — Other Ambulatory Visit: Payer: Self-pay

## 2020-11-25 ENCOUNTER — Telehealth: Payer: Self-pay | Admitting: Surgery

## 2020-11-25 ENCOUNTER — Emergency Department (HOSPITAL_COMMUNITY)
Admission: EM | Admit: 2020-11-25 | Discharge: 2020-11-25 | Disposition: A | Discharge status: Home / Self Care | Payer: 59 | Attending: Emergency Medicine | Admitting: Emergency Medicine

## 2020-11-25 DIAGNOSIS — R110 Nausea: Secondary | ICD-10-CM | POA: Insufficient documentation

## 2020-11-25 DIAGNOSIS — X500XXA Overexertion from strenuous movement or load, initial encounter: Secondary | ICD-10-CM | POA: Diagnosis not present

## 2020-11-25 DIAGNOSIS — R0789 Other chest pain: Secondary | ICD-10-CM

## 2020-11-25 DIAGNOSIS — F17200 Nicotine dependence, unspecified, uncomplicated: Secondary | ICD-10-CM | POA: Diagnosis not present

## 2020-11-25 DIAGNOSIS — Y990 Civilian activity done for income or pay: Secondary | ICD-10-CM | POA: Insufficient documentation

## 2020-11-25 DIAGNOSIS — R0602 Shortness of breath: Secondary | ICD-10-CM | POA: Diagnosis not present

## 2020-11-25 DIAGNOSIS — M940 Chondrocostal junction syndrome [Tietze]: Secondary | ICD-10-CM | POA: Diagnosis not present

## 2020-11-25 DIAGNOSIS — R072 Precordial pain: Secondary | ICD-10-CM | POA: Diagnosis present

## 2020-11-25 DIAGNOSIS — R42 Dizziness and giddiness: Secondary | ICD-10-CM | POA: Insufficient documentation

## 2020-11-25 DIAGNOSIS — R600 Localized edema: Secondary | ICD-10-CM | POA: Diagnosis not present

## 2020-11-25 DIAGNOSIS — R001 Bradycardia, unspecified: Secondary | ICD-10-CM | POA: Diagnosis not present

## 2020-11-25 LAB — I-STAT BETA HCG BLOOD, ED (MC, WL, AP ONLY): I-stat hCG, quantitative: <5 m[IU]/mL (ref <5)

## 2020-11-25 LAB — BASIC METABOLIC PANEL
Anion gap: 7 (ref 5–15)
BUN: 9 mg/dL (ref 6–20)
CO2: 27 mmol/L (ref 22–32)
Calcium: 9.2 mg/dL (ref 8.9–10.3)
Chloride: 104 mmol/L (ref 98–111)
Creatinine, Ser: 0.88 mg/dL (ref 0.44–1.00)
GFR, Estimated: >60 mL/min (ref >60)
Glucose, Bld: 116 mg/dL — ABNORMAL HIGH (ref 70–99)
Potassium: 4.1 mmol/L (ref 3.5–5.1)
Sodium: 138 mmol/L (ref 135–145)

## 2020-11-25 LAB — CBC
HCT: 36.1 % (ref 36.0–46.0)
Hemoglobin: 12.3 g/dL (ref 12.0–15.0)
MCH: 29.7 pg (ref 26.0–34.0)
MCHC: 34.1 g/dL (ref 30.0–36.0)
MCV: 87.2 fL (ref 80.0–100.0)
Platelets: 238 10*3/uL (ref 150–400)
RBC: 4.14 MIL/uL (ref 3.87–5.11)
RDW: 12.6 % (ref 11.5–15.5)
WBC: 9.4 10*3/uL (ref 4.0–10.5)
nRBC: 0 % (ref 0.0–0.2)

## 2020-11-25 LAB — TSH: TSH: 4.674 u[IU]/mL — ABNORMAL HIGH (ref 0.350–4.500)

## 2020-11-25 LAB — TROPONIN I (HIGH SENSITIVITY)
Troponin I (High Sensitivity): <2 ng/L (ref <18)
Troponin I (High Sensitivity): <2 ng/L (ref <18)

## 2020-11-25 LAB — MAGNESIUM: Magnesium: 1.9 mg/dL (ref 1.7–2.4)

## 2020-11-25 LAB — D-DIMER, QUANTITATIVE: D-Dimer, Quant: 0.41 ug/mL-FEU (ref 0.00–0.50)

## 2020-11-25 MED ORDER — CYCLOBENZAPRINE HCL 5 MG PO TABS: 5.0000 mg | ORAL_TABLET | Freq: Three times a day (TID) | ORAL | 0 refills | Status: DC | PRN

## 2020-11-25 MED ORDER — SODIUM CHLORIDE 0.9 % IV BOLUS
1000.0000 mL | Freq: Once | INTRAVENOUS | Status: AC
  Administered 2020-11-25: 1000 mL via INTRAVENOUS

## 2020-11-25 NOTE — ED Provider Notes (Signed)
Emergency Medicine Provider Triage Evaluation Note  Valerie Green , a 35 y.o. female  was evaluated in triage.  Pt complains of chest pain.  Onset 5 days ago.  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  Denies fever or chills.  Denies cough.  States she had SOB and dizziness tonight that prompted her to come to the ER.  Review of Systems  Positive: CP, SOB, dizziness Negative: Cough, fever  Physical Exam  BP (!) 120/100 (BP Location: Right Arm)   Pulse 69   Temp 98.3 F (36.8 C)   Resp 17   Ht 5\' 4"  (1.626 m)   Wt (!) 162.4 kg   SpO2 100%   BMI 61.45 kg/m  Gen:   Awake, no distress   Resp:  Normal effort  MSK:   Moves extremities without difficulty  Other:    Medical Decision Making  Medically screening exam initiated at 2:07 AM.  Appropriate orders placed.  Valerie Green was informed that the remainder of the evaluation will be completed by another provider, this initial triage assessment does not replace that evaluation, and the importance of remaining in the ED until their evaluation is complete.  CP, SOB   Kennith Gain, PA-C 11/25/20 0209    0210, MD 11/25/20 [phone]

## 2020-11-25 NOTE — Telephone Encounter (Signed)
ED RNCM received call from patient stating that [facility] is requesting clarification on the discharge prescription. Message sent to EDP awaiting a response.

## 2020-11-25 NOTE — ED Notes (Signed)
Reviewed discharge instructions with patient. Follow-up care and medications reviewed. Patient  verbalized understanding. Patient A&Ox4, VSS, and ambulatory with steady gait upon discharge.  °

## 2020-11-25 NOTE — ED Triage Notes (Addendum)
Pt c/o centralized chest pain described as dull aching and constant pressure onset 9 pm yesterday while at work. Lightheadedness, weakness, dizziness and SOB. HX dysrhythmias

## 2020-11-25 NOTE — Discharge Instructions (Addendum)
Advised to return to the ED if your chest pain significantly worsens or radiates to your arm/jaw, causes trouble breathing, or if you pass out. Please call to make an appt to establish care in the Internal Medicine Clinic with Dr. Ned Card.

## 2020-11-25 NOTE — ED Provider Notes (Signed)
[facility] EMERGENCY DEPARTMENT Provider Note   CSN: [id number] Arrival date & time: 11/25/20  0116     History Chief Complaint  Patient presents with   Chest Pain    Valerie Green is a 35 y.o. female with no PMHx presenting to the ED following 5 days of worsening chest pain.  Patient states that her chest pain is provoked by work and that she works with moving and packaging heavy bulk items.  She notes that typically her pain improves after she gets home from work and is able to rest.  She states that last night her pain got worse while she was at work and she had associated shortness of breath, dizziness, lightheadedness, and nausea, and felt like she was going to pass out.  She describes her pain as an aching sensation and that it occasionally feels like a pressure or shooting pain that radiates to her back. Pain does not radiate to her arms or jaw. She denies any fevers, chills, abdominal pain, vomiting, diarrhea, melena, hematochezia, dysuria, hematuria, numbness/tingling, or any other symptoms at this time. While in the room, she states that she had 1 episode of shooting chest pain in the middle of her chest. She feels like her heart is racing, but HR is in the low 40s on the monitor. Patient notes that she has a family hx of cardiovascular disease and diabetes.   The history is provided by the patient.  Chest Pain Pain location:  Substernal area Pain quality: aching and pressure   Pain radiates to:  Mid back Pain severity:  Moderate Onset quality:  Unable to specify Duration:  5 days Timing:  Intermittent Progression:  Worsening Chronicity:  New Context: lifting and movement   Relieved by:  Rest Worsened by:  Exertion Ineffective treatments:  None tried Associated symptoms: back pain, dizziness, nausea and shortness of breath   Associated symptoms: no abdominal pain, no cough, no diaphoresis, no fever, no headache, no lower extremity edema and no  vomiting       Past Medical History:  Diagnosis Date   ADD (attention deficit disorder)    Allergy    Anemia    Anxiety state 09/07/2015   Dysrhythmia    Gastritis    GERD (gastroesophageal reflux disease)    Headache    Pneumonia    Pre-diabetes     Patient Active Problem List   Diagnosis Date Noted   Diarrhea 06/23/2018   Insomnia 01/19/2016   Morbid obesity (HCC) 01/19/2016   S/P laparoscopic cholecystectomy August 2017 12/23/2015   Cholecystitis 12/22/2015   Anxiety disorder 09/07/2015    Past Surgical History:  Procedure Laterality Date   LAPAROSCOPIC CHOLECYSTECTOMY SINGLE SITE WITH INTRAOPERATIVE CHOLANGIOGRAM N/A 12/22/2015   Procedure: LAPAROSCOPIC CHOLECYSTECTOMY WITH INTRAOPERATIVE CHOLANGIOGRAM;  Surgeon: Ovidio Kin, MD;  Location: [facility] OR;  Service: General;  Laterality: N/A;     OB History     Gravida  1   Para      Term      Preterm      AB      Living         SAB      IAB      Ectopic      Multiple      Live Births              Family History  Problem Relation Age of Onset   Hyperlipidemia Brother    Diabetes Paternal Uncle  Colon cancer Neg Hx    Esophageal cancer Neg Hx    Pancreatic cancer Neg Hx    Stomach cancer Neg Hx    Liver disease Neg Hx     Social History   Tobacco Use   Smoking status: Some Days   Smokeless tobacco: Never  Substance Use Topics   Alcohol use: Yes    Alcohol/week: 0.0 standard drinks    Comment: occ   Drug use: No    Home Medications Prior to Admission medications   Medication Sig Start Date End Date Taking? Authorizing Provider  cholestyramine (QUESTRAN) 4 g packet Take 1 packet (4 g total) by mouth 4 (four) times daily. 01/19/20   Midge Minium, MD  doxepin [facility]) 10 MG/ML solution TAKE 1 ML(10 MG) BY MOUTH AT BEDTIME 03/15/19   [place], Betty G, MD  ketoconazole (NIZORAL) 2 % shampoo Apply 1 application topically 2 (two) times a week. 06/03/19   [place], Betty G, MD  naproxen  (NAPROSYN) 500 MG tablet Take 500 mg by mouth 2 (two) times daily. 08/17/19   [provider]  triamcinolone cream (KENALOG) 0.1 % Apply 1 application topically 2 (two) times daily. 06/02/19   [place], Betty G, MD    Allergies    Coffee bean extract [coffea arabica], Other, Lactalbumin, Milk-related compounds, and Penicillins  Review of Systems   Review of Systems  Constitutional:  Negative for diaphoresis and fever.  HENT:  Negative for nosebleeds, rhinorrhea and tinnitus.   Respiratory:  Positive for shortness of breath. Negative for cough.   Cardiovascular:  Positive for chest pain.  Gastrointestinal:  Positive for nausea. Negative for abdominal pain, blood in stool, diarrhea and vomiting.  Genitourinary:  Negative for dysuria and hematuria.  Musculoskeletal:  Positive for back pain. Negative for myalgias.  Neurological:  Positive for dizziness and light-headedness. Negative for syncope, speech difficulty and headaches.   Physical Exam Updated Vital Signs BP 121/86   Pulse (!) 51   Temp 98 F (36.7 C) (Oral)   Resp 18   Ht 5\' 4"  (1.626 m)   Wt (!) 162.4 kg   SpO2 100%   BMI 61.45 kg/m   Physical Exam Constitutional:      General: She is not in acute distress.    Appearance: She is obese.  HENT:     Head: Normocephalic and atraumatic.  Eyes:     Pupils: Pupils are equal, round, and reactive to light.  Cardiovascular:     Rate and Rhythm: Regular rhythm. Bradycardia present.     Pulses:          Carotid pulses are 2+ on the right side and 2+ on the left side.      Radial pulses are 2+ on the right side and 2+ on the left side.       Dorsalis pedis pulses are 2+ on the right side and 2+ on the left side.       Posterior tibial pulses are 2+ on the right side and 2+ on the left side.     Heart sounds: Normal heart sounds. No murmur heard. Pulmonary:     Effort: Pulmonary effort is normal. No respiratory distress.     Breath sounds: No wheezing, rhonchi or rales.   Abdominal:     General: There is no distension.     Palpations: Abdomen is soft.     Tenderness: There is no abdominal tenderness.  Musculoskeletal:     Right lower leg: Edema present.  Left lower leg: Edema present.     Comments: 1-2+ bilateral lower extremity edema (baseline)  Skin:    General: Skin is warm and dry.  Neurological:     General: No focal deficit present.     Mental Status: She is alert and oriented to person, place, and time. Mental status is at baseline.    ED Results / Procedures / Treatments   Labs (all labs ordered are listed, but only abnormal results are displayed) Labs Reviewed  BASIC METABOLIC PANEL - Abnormal; Notable for the following components:      Result Value   Glucose, Bld 116 (*)    All other components within normal limits  TSH - Abnormal; Notable for the following components:   TSH 4.674 (*)    All other components within normal limits  CBC  MAGNESIUM  D-DIMER, QUANTITATIVE  I-STAT BETA HCG BLOOD, ED ([facility], [facility], [facility] ONLY)  TROPONIN I (HIGH SENSITIVITY)  TROPONIN I (HIGH SENSITIVITY)    EKG EKG Interpretation  Date/Time:  Saturday November 25 2020 09:58:15 EDT Ventricular Rate:  56 PR Interval:  171 QRS Duration: 93 QT Interval:  448 QTC Calculation: 433 R Axis:   81 Text Interpretation: Sinus rhythm When compared to prior similar apperace slightly slower. No STEMI Confirmed by Theda Belfastegeler, Chris ([id number]) on 11/25/2020 10:14:05 AM  Radiology DG Chest 2 View  Result Date: 11/25/2020 CLINICAL DATA:  Lower sternal chest pain. EXAM: CHEST - 2 VIEW COMPARISON:  December 29, 2017 FINDINGS: The heart size and mediastinal contours are within normal limits. Both lungs are clear. Radiopaque surgical clips are seen within the right upper quadrant. The visualized skeletal structures are unremarkable. IMPRESSION: No active cardiopulmonary disease. Electronically Signed   By: Aram Candelahaddeus  Houston M.D.   On: 11/25/2020 03:06    Procedures Procedures    Medications Ordered in ED Medications  sodium chloride 0.9 % bolus 1,000 mL (0 mLs Intravenous Stopped 11/25/20 1202)    ED Course  I have reviewed the triage vital signs and the nursing notes.  Pertinent labs & imaging results that were available during my care of the patient were reviewed by me and considered in my medical decision making (see chart for details).    MDM Rules/Calculators/A&P                           Patient is a 35 yo female presenting with 5 days chest pain of chest pain that is worse while the patient is at work and typically resolves with rest. Last night while she was at work the pain was worse and she had associated shortness of breath, dizziness, lightheadedness, and nausea.  She describes the pain as aching and occasionally has a dull pressure.  Evaluating the patient she states that she had a shooting pain in her chest that radiated to her back. Initial EKG, CXR, and troponins were all negative. Patient is PERC negative. Patient given 1 L fluid bolus. Repeat EKG done showing normal sinus rhythm. TSH, Mg, d dimer ordered. D dimer negative. Mg within normal limits. TSH slightly elevated at 4.6.   On reevaluation the patient states that she is feeling better. She still has a slight ache in her chest but it is not sharp and does not feel like a pressure. Discussed musculoskeletal causes of chest pain with the patient and will discharge her home. Patient advised to establish care with a PCP.   Final Clinical Impression(s) / ED Diagnoses Final  diagnoses:  Chest wall pain  Costochondritis    Rx / DC Orders ED Discharge Orders     None        Chauncey Mann, DO 11/25/20 1226    Tegeler, Canary Brim, MD 11/26/20 [phone]

## 2020-12-06 ENCOUNTER — Encounter: Payer: Self-pay | Admitting: Student

## 2020-12-06 ENCOUNTER — Other Ambulatory Visit: Payer: Self-pay

## 2020-12-06 ENCOUNTER — Ambulatory Visit (INDEPENDENT_AMBULATORY_CARE_PROVIDER_SITE_OTHER): Payer: 59 | Admitting: Student

## 2020-12-06 VITALS — BP 115/67 | HR 66 | Temp 98.6°F | Ht 64.0 in | Wt 340.2 lb

## 2020-12-06 DIAGNOSIS — Z0000 Encounter for general adult medical examination without abnormal findings: Secondary | ICD-10-CM | POA: Diagnosis not present

## 2020-12-06 DIAGNOSIS — R197 Diarrhea, unspecified: Secondary | ICD-10-CM

## 2020-12-06 DIAGNOSIS — R7303 Prediabetes: Secondary | ICD-10-CM

## 2020-12-06 DIAGNOSIS — F32A Depression, unspecified: Secondary | ICD-10-CM | POA: Insufficient documentation

## 2020-12-06 DIAGNOSIS — R739 Hyperglycemia, unspecified: Secondary | ICD-10-CM | POA: Diagnosis not present

## 2020-12-06 DIAGNOSIS — F411 Generalized anxiety disorder: Secondary | ICD-10-CM

## 2020-12-06 DIAGNOSIS — Z6841 Body Mass Index (BMI) 40.0 and over, adult: Secondary | ICD-10-CM

## 2020-12-06 DIAGNOSIS — Z9049 Acquired absence of other specified parts of digestive tract: Secondary | ICD-10-CM

## 2020-12-06 DIAGNOSIS — R7989 Other specified abnormal findings of blood chemistry: Secondary | ICD-10-CM

## 2020-12-06 DIAGNOSIS — Z113 Encounter for screening for infections with a predominantly sexual mode of transmission: Secondary | ICD-10-CM

## 2020-12-06 DIAGNOSIS — F320 Major depressive disorder, single episode, mild: Secondary | ICD-10-CM

## 2020-12-06 LAB — POCT GLYCOSYLATED HEMOGLOBIN (HGB A1C): Hemoglobin A1C: 5.7 % — AB (ref 4.0–5.6)

## 2020-12-06 LAB — GLUCOSE, CAPILLARY: Glucose-Capillary: 144 mg/dL — ABNORMAL HIGH (ref 70–99)

## 2020-12-06 NOTE — Assessment & Plan Note (Addendum)
Check HIV and hepatitis C - both negative

## 2020-12-06 NOTE — Assessment & Plan Note (Signed)
Patient reports anxiety symptoms.  Her GAD-7 score was 10, which places her in the moderate anxiety group.  -Referred to Dr. Monna Fam

## 2020-12-06 NOTE — Assessment & Plan Note (Signed)
Patient endorses GI symptom of diarrhea, abdominal bloating and cramping with certain food since her cholecystectomy.  She has seen GI physician in the past but currently not take any medications.  States that she only has diarrhea once a week, currently does not need medication to relieve symptoms.  - Advised patient to keep a food diary to figure out what food can upset her stomach.  - Advised patient to avoid fatty food.

## 2020-12-06 NOTE — Assessment & Plan Note (Signed)
TSH checked in the ED was slightly elevated at 4.6.  Was normal in the past.  Doubt that is causing her symptoms and weight gain.  -Recheck TSH at next visit

## 2020-12-06 NOTE — Progress Notes (Signed)
CC: Establishing care, weight loss management  HPI:  [name] is a 35 y.o. with no established medical history presented to the clinic to establish care.  Patient was seen in the ED on 7/23 for chest pain that started 5 days ago.  States that she is working at a [employer] which she has to do heavy lifting.  This is a new change to her job.  In the ED, EKG was normal sinus rhythm.  D-dimer and troponin were negative.  CBC and CMP were unremarkable.  TSH was slightly elevated at 4.6.  This is thought due to MSK.  She was discharged with NSAIDs and Flexeril.  Patient has been taking Aleve with relief.  She does not take Flexeril because of his drowsiness side effects.  Patient had a cholecystectomy done in 2017.  Ever since the procedure, patient reports GI symptoms of diarrhea, abdominal bloating and cramping.  She was seen by GI physician in the past and does not take any medications currently.  She states that her diarrhea has improved, only happened once a week.  Patient reports depression and anxiety symptoms that been going on her life.  Said that she was in a support therapy group, which helped.  Said that she no longer in the group due to insurance issue.  She endorses depressed mood, fatigue, difficult concentrating, change in appetite, guilt, low self-esteem.  She denies suicidal thoughts.  She has not tried any medications in the past.  Social history: Denies smoking, alcohol or drug use.  Reports vaping non-nicotine substance occasionally.  Family history:  Father-diabetes Grandmother-pancreatic cancer  Surgical history: Cholecystectomy 2017  Past Medical History:  Diagnosis Date   ADD (attention deficit disorder)    Allergy    Anemia    Anxiety state 09/07/2015   Dysrhythmia    Gastritis    GERD (gastroesophageal reflux disease)    Headache    Pneumonia    Pre-diabetes    Review of Systems:   Per HPI  Physical Exam:  Vitals:   12/06/20 0903   BP: 115/67  Pulse: 66  Temp: 98.6 F (37 C)  TempSrc: Oral  SpO2: 100%  Weight: (!) 340 lb 3.2 oz (154.3 kg)  Height: 5\' 4"  (1.626 m)   Physical Exam Constitutional:      General: She is not in acute distress.    Appearance: She is obese. She is not toxic-appearing.  HENT:     Head: Normocephalic.  Eyes:     Conjunctiva/sclera: Conjunctivae normal.     Pupils: Pupils are equal, round, and reactive to light.  Cardiovascular:     Rate and Rhythm: Normal rate and regular rhythm.     Heart sounds: Normal heart sounds. No murmur heard. Pulmonary:     Effort: Pulmonary effort is normal. No respiratory distress.     Breath sounds: Normal breath sounds. No wheezing.  Abdominal:     General: Bowel sounds are normal. There is no distension.     Tenderness: There is no abdominal tenderness.  Musculoskeletal:        General: Normal range of motion.     Cervical back: Normal range of motion.  Skin:    General: Skin is warm.     Coloration: Skin is not jaundiced.  Neurological:     Mental Status: She is alert and oriented to person, place, and time.  Psychiatric:        Mood and Affect: Mood normal.  Behavior: Behavior normal.     Assessment & Plan:   See Encounters Tab for problem based charting.  Patient discussed with Dr. Mikey Bussing

## 2020-12-06 NOTE — Assessment & Plan Note (Signed)
A1c 5.7 today.  -Will work on lifestyle modification with healthy diet and exercise.  Referral to [facility] for diabetic and weight loss education. -Avoid metformin at this time due to diarrhea from cholecystectomy -In the future, patient may benefit from GLP-1 if covered by insurance.

## 2020-12-06 NOTE — Assessment & Plan Note (Signed)
Patient reports depression symptoms for her whole life. She endorses depressed mood, fatigue, difficult concentrating, change in appetite, guilt, low self-esteem.  She denies suicidal thoughts.  She has not tried any medications in the past.  Patient was in a therapy group in the past which has helped.  Assessment and plan She has 5/9 criteria for MDD.  Her PHQ-9 score was 8, which places her in the mild depression category.  We discussed treatment option between medication and therapy.  Patient would like to talk to Dr. Monna Fam before starting any medications.   -Referral to behavioral therapy with Dr. Monna Fam

## 2020-12-06 NOTE — Patient Instructions (Signed)
Valerie Green,  It was a pleasure seeing you in the clinic today.  Here is a summary of what we talked about:  1.  Prediabetes: A1c 5.7 today.  Please work on lifestyle modifications with healthy diet and exercise.  I have placed a referral to our registered dietitian, Norm Parcel.  She will help with guidance on healthy diet.  2.  Anxiety/depression: I have sent a referral to Dr. Monna Fam, our behavioral therapy specialist.   3.  Weight issue: Please work on diet and exercise.  Please meet with Norm Parcel for guidance on lifestyle modifications.  We can talk about other interventions at next visit.  Please return to clinic in 6 months  Take care,  Dr. Cyndie Chime

## 2020-12-06 NOTE — Assessment & Plan Note (Addendum)
BMI is 58.  We discussed treatment options including lifestyle modification, medication and bariatric surgery.  Patient would like to meet with Norm Parcel for diet education before medical management.  -Referral to [facility] -She might benefit from GLP-1 if covered by insurance. -Check lipid panel  Addendum Lipid panel looks great.  No medication indicated

## 2020-12-06 NOTE — Progress Notes (Signed)
Internal Medicine Clinic Attending  Case discussed with Dr. Nguyen  At the time of the visit.  We reviewed the resident's history and exam and pertinent patient test results.  I agree with the assessment, diagnosis, and plan of care documented in the resident's note. 

## 2020-12-07 ENCOUNTER — Encounter: Payer: Self-pay | Admitting: Student

## 2020-12-07 LAB — LIPID PANEL
Chol/HDL Ratio: 2.6 ratio (ref 0.0–4.4)
Cholesterol, Total: 129 mg/dL (ref 100–199)
HDL: 50 mg/dL (ref >39)
LDL Chol Calc (NIH): 58 mg/dL (ref 0–99)
Triglycerides: 119 mg/dL (ref 0–149)
VLDL Cholesterol Cal: 21 mg/dL (ref 5–40)

## 2020-12-07 LAB — HIV ANTIBODY (ROUTINE TESTING W REFLEX): HIV Screen 4th Generation wRfx: NONREACTIVE

## 2020-12-07 LAB — HEPATITIS C ANTIBODY: Hep C Virus Ab: 0.1 s/co ratio (ref 0.0–0.9)

## 2020-12-27 ENCOUNTER — Ambulatory Visit: Payer: 59 | Admitting: Behavioral Health

## 2020-12-27 DIAGNOSIS — R454 Irritability and anger: Secondary | ICD-10-CM

## 2020-12-27 DIAGNOSIS — F419 Anxiety disorder, unspecified: Secondary | ICD-10-CM

## 2020-12-27 DIAGNOSIS — F331 Major depressive disorder, recurrent, moderate: Secondary | ICD-10-CM

## 2020-12-27 NOTE — BH Specialist Note (Signed)
Integrated Behavioral Health Initial In-Person Visit  MRN: [id number] Name: Valerie Green  Number of [place] Clinician visits:: 1/6 Session Start time: 9:00am  Session End time: 9:50am Total time: 50  minutes  Types of Service: Individual psychotherapy  Interpretor: No  Interpretor Name and Language: n/a   Warm Hand Off Completed.        Subjective: [name] is a 35 y.o. female accompanied by  self Patient was referred by Dr. Erby Pian, DO for anx/dep & anger issues. Patient reports the following symptoms/concerns: Hx of Family dynamic issues since childhood, heightened anx/dep & anger due to health status changes Duration of problem: yrs w/Family Hx-this year w/her elevated anx/dep/anger issues; Severity of problem: moderate  Objective: Mood: Anxious and Depressed and Affect: Appropriate Risk of harm to self or others: No plan to harm self or others  Life Context: Family and Social: Pt is currently living w/her Parents. Pt has one good friend she met in [place]. 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 Pt does not attend School.  Self-Care: Pt is constantly working from 60-75 hr/wk, but cares for diet & basic self-care needs Life Changes: Pt had gall bladder removed in 2017 & has resultant issues w/her digestion since that time.  Patient and/or Family's Strengths/Protective Factors: Social connections, Social and Emotional competence, and Sense of purpose  Goals Addressed: Patient will: Reduce symptoms of: anxiety, depression, and anger rxn Increase knowledge and/or ability of: coping skills, healthy habits, self-management skills, and stress reduction  Demonstrate ability to: Increase healthy adjustment to current life circumstances, Increase adequate support systems for patient/family, and f/u on Referral to [facility] via Dr. Alease Frame, DO  Progress towards Goals: Estb'd today: Pt will attend psychotherapy for a few  visits to get on track & process her Hx with Family & current issues she deals with daily.  Interventions: Interventions utilized:  Intro w/Eval/Assess   Standardized Assessments completed:  screeners prn  Patient and/or Family Response: Pt receptive to call today & enthusiastic about future sessions.   Patient Centered Plan: Patient is on the following Treatment Plan(s):  Pt will keep a Notebook btwn sessions to focus her concerns.   Assessment: Patient currently experiencing elevated anx/dep & anger due to her work schedule that is 60-75 hrs/wk.   Patient may benefit from cont'd support & encouragement for her efforts to reduce debt & manage her schedule.  Plan: Follow up with behavioral health clinician on : 2-3 wks on telehealth for 60 min Behavioral recommendations: Keep your Notebook so we can tend to your concerns Referral(s): [facility] (In Clinic) "From scale of 1-10, how likely are you to follow plan?": [place], LMFT

## 2021-01-22 ENCOUNTER — Ambulatory Visit: Payer: 59 | Admitting: Behavioral Health

## 2021-02-06 ENCOUNTER — Ambulatory Visit: Payer: 59 | Admitting: Behavioral Health

## 2021-02-06 DIAGNOSIS — F331 Major depressive disorder, recurrent, moderate: Secondary | ICD-10-CM

## 2021-02-06 DIAGNOSIS — F419 Anxiety disorder, unspecified: Secondary | ICD-10-CM

## 2021-02-06 NOTE — BH Specialist Note (Signed)
Integrated Behavioral Health via Telemedicine Visit  02/06/2021 Valerie Green [id number]  Number of Integrated Behavioral Health visits: 2/6 Session Start time: 10:00am  Session End time: 10:45am Total time: 45   Referring Provider: Dr. Marsa Aris, DO Patient/Family location: Pt is @ the [facility] staying in Parent's Time Share in [place] [facility] Provider location: [facility] [place] Office All persons participating in visit: Pt & Clinician  Types of Service: Individual psychotherapy  I connected with Valerie Green and/or Valerie Kirschner Lockridge's  self  via  Telephone or Video Enabled Telemedicine Application  (Video is Caregility application) and verified that I am speaking with the correct person using two identifiers. Discussed confidentiality:  2nd visit  I discussed the limitations of telemedicine and the availability of in person appointments.  Discussed there is a possibility of technology failure and discussed alternative modes of communication if that failure occurs.  I discussed that engaging in this telemedicine visit, they consent to the provision of behavioral healthcare and the services will be billed under their insurance.  Patient and/or legal guardian expressed understanding and consented to Telemedicine visit:  2nd visit  Presenting Concerns: Patient and/or family reports the following symptoms/concerns: reduced Sx of dep due to vacation exp in [place] [facility] [place] Duration of problem: months; Severity of problem: moderate  Patient and/or Family's Strengths/Protective Factors: Social and Emotional competence, Concrete supports in place (healthy food, safe environments, etc.), and Sense of purpose  Goals Addressed: Patient will:  Reduce symptoms of: anxiety, depression, and stress   Increase knowledge and/or ability of: coping skills and stress reduction   Demonstrate ability to: Increase healthy adjustment to current life circumstances  Progress towards  Goals: Ongoing  Interventions: Interventions utilized:  Solution-Focused Strategies and Supportive Counseling Standardized Assessments completed:  screeners prn  Patient and/or Family Response: Pt receptive to call & requests future appt  Assessment: Patient currently experiencing reduced anx/dep due to vacation. Pt sts her dep Sx vary daily due to her lack of sleep & hard work schedule.  Patient may benefit from cont'd support for her Sx of dep/anx. 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 Her Fr has been more lenient about her living situation since he sees how much she is working. Her hrs are now 80/wk. Pt has accepted the fact she may not be able to move out for another yr due to inflation & the economy. She is fine w/this & being realistic.  Plan: Follow up with behavioral health clinician on : 2-3 wks on telehealth for 30 min Behavioral recommendations: None @ this time; enjoy vacation!! Referral(s): Integrated [facility] (In Clinic)  I discussed the assessment and treatment plan with the patient and/or parent/guardian. They were provided an opportunity to ask questions and all were answered. They agreed with the plan and demonstrated an understanding of the instructions.   They were advised to call back or seek an in-person evaluation if the symptoms worsen or if the condition fails to improve as anticipated.  Deneise Lever, LMFT

## 2021-02-14 ENCOUNTER — Ambulatory Visit (INDEPENDENT_AMBULATORY_CARE_PROVIDER_SITE_OTHER): Payer: 59 | Admitting: Internal Medicine

## 2021-02-14 DIAGNOSIS — R0781 Pleurodynia: Secondary | ICD-10-CM

## 2021-02-14 NOTE — Progress Notes (Signed)
  [facility] Internal Medicine Residency Telephone Encounter Continuity Care Appointment  HPI:  This telephone encounter was created for Ms. Valerie Green on 02/14/2021 for the following purpose/cc chest wall pain.   Past Medical History:  Past Medical History:  Diagnosis Date   ADD (attention deficit disorder)    Allergy    Anemia    Anxiety state 09/07/2015   Dysrhythmia    Gastritis    GERD (gastroesophageal reflux disease)    Headache    Pneumonia    Pre-diabetes      ROS:  Negative except as stated in HPI.   Assessment / Plan / Recommendations:  Please see A&P under problem oriented charting for assessment of the patient's acute and chronic medical conditions.  As always, pt is advised that if symptoms worsen or new symptoms arise, they should go to an urgent care facility or to to ER for further evaluation.   Consent and Medical Decision Making:  Patient discussed with Dr.  Heide Spark This is a telephone encounter between [facility] and Valerie Green on 02/14/2021 for chest wall pain. The visit was conducted with the patient located at home and Valerie Green at [facility]. The patient's identity was confirmed using their DOB and current address. The patient has consented to being evaluated through a telephone encounter and understands the associated risks (an examination cannot be done and the patient may need to come in for an appointment) / benefits (allows the patient to remain at home, decreasing exposure to coronavirus). I personally spent 10 minutes on medical discussion.    Mr. Valerie Green is a 35 year old female without any significant past medical history presenting for evaluation of pleuritic chest pain.  She was previously evaluated for this in the ED on 11/25/2020 for similar concerns of chest pain.  At the time, chest pain suspect to be pleuritic in nature with normal EKG and troponins and chest x-ray.  Patient was recommended for NSAID and muscle  relaxer.  She does report some improvement with this but reports ongoing intermittent central/right-sided chest pain.  Notes some improvement with Aleve.  However, has also noted some joint pains in her wrist and arms.  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  She denies any heavy lifting.  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

## 2021-02-15 NOTE — Progress Notes (Signed)
Internal Medicine Clinic Attending ° °Case discussed with Dr. Aslam  At the time of the visit.  We reviewed the resident’s history and exam and pertinent patient test results.  I agree with the assessment, diagnosis, and plan of care documented in the resident’s note.  °

## 2021-02-21 ENCOUNTER — Telehealth: Payer: Self-pay | Admitting: Behavioral Health

## 2021-02-21 ENCOUNTER — Ambulatory Visit (INDEPENDENT_AMBULATORY_CARE_PROVIDER_SITE_OTHER): Payer: 59 | Admitting: Internal Medicine

## 2021-02-21 ENCOUNTER — Ambulatory Visit: Payer: 59 | Admitting: Behavioral Health

## 2021-02-21 ENCOUNTER — Ambulatory Visit (HOSPITAL_COMMUNITY)
Admission: RE | Admit: 2021-02-21 | Discharge: 2021-02-21 | Disposition: A | Discharge status: Home / Self Care | Payer: 59 | Source: Ambulatory Visit | Attending: Internal Medicine | Admitting: Internal Medicine

## 2021-02-21 ENCOUNTER — Encounter: Payer: Self-pay | Admitting: Internal Medicine

## 2021-02-21 ENCOUNTER — Other Ambulatory Visit: Payer: Self-pay

## 2021-02-21 VITALS — BP 129/93 | HR 58 | Temp 98.4°F | Resp 28 | Ht 64.0 in | Wt 337.7 lb

## 2021-02-21 DIAGNOSIS — R0782 Intercostal pain: Secondary | ICD-10-CM | POA: Diagnosis not present

## 2021-02-21 DIAGNOSIS — M2550 Pain in unspecified joint: Secondary | ICD-10-CM

## 2021-02-21 DIAGNOSIS — M130 Polyarthritis, unspecified: Secondary | ICD-10-CM

## 2021-02-21 DIAGNOSIS — R001 Bradycardia, unspecified: Secondary | ICD-10-CM

## 2021-02-21 DIAGNOSIS — R079 Chest pain, unspecified: Secondary | ICD-10-CM | POA: Diagnosis not present

## 2021-02-21 MED ORDER — PANTOPRAZOLE SODIUM 40 MG PO TBEC: 40.0000 mg | DELAYED_RELEASE_TABLET | Freq: Every day | ORAL | 3 refills | Status: AC

## 2021-02-21 NOTE — Assessment & Plan Note (Signed)
Patient is presenting for evaluation of central chest pain. She was initially evaluated for this in the ED on 11/25/2020.  At the time, chest pain suspect to be pleuritic in nature with normal EKG and troponins and chest x-ray.  Patient was recommended for NSAID and muscle relaxer.  She does report some improvement with this but reports ongoing central chest pain radiating across her chest and upper abdomen. She previously attributed this to working two jobs, one which was in [employer]. However, noted persistence of the pain even on vacation. She notes that she does get some relief when she sits down and pushes her arm against her chest. She denies any dyspnea, headache, lightheadedness or nausea. Notes that at rest, pain is more epigastric and radiating across upper abdomen. Notes that this is worse with eating junk foods. Pain is nonreproducible on exam. EKG obtained during OV - sinus bradycardia.  Suspect that her symptoms are in setting of GERD/PUD. At this time, advised for dietary changes and will trial PPI therapy  Plan: Protonix 40mg  daily Continue to monitor symptoms; if persistent, may benefit from EGD

## 2021-02-21 NOTE — Assessment & Plan Note (Signed)
Patient endorses pain in bilateral wrists radiating to the palmar aspect of her hands and in the right ankle. She works at [employer] at [employer] job and also works in Pensions consultant at [employer]. She notes often having to wear steal boots and believes this may be contributing to her ankle pain.  On exam, no joint deformity, erythema, warmth or tenderness appreciated.  Will obtain labs to r/o inflammatory joint disease. However, suspect her symptoms may be in setting of overuse. Bilateral wrist pain may be attributed to carpal tunnel in setting of severe obesity and overuse and right ankle pain could be in setting of poorly fitted shoes.   Plan:  ESR and CRP  If negative, recommend for symptomatic tx

## 2021-02-21 NOTE — Progress Notes (Signed)
   CC: pleuritic chest pain  HPI:  Valerie Green is a 35 y.o. female with PMHx as stated below presenting for evaluation of chest pain. She endorses central chest tightness/aching and feeling like "ate something and its stuck there" since July 2022. She was evaluated for this in the ED on 11/25/20 and was recommended for NSAID and muscle relaxer. She reports taking this with some improvement but pain has persisted. Please see problem based charting for complete assessment and plan.    Past Medical History:  Diagnosis Date   ADD (attention deficit disorder)    Allergy    Anemia    Anxiety state 09/07/2015   Dysrhythmia    Gastritis    GERD (gastroesophageal reflux disease)    Headache    Pneumonia    Pre-diabetes    Review of Systems:  Negative except as stated in HPI.  Physical Exam:  Vitals:   02/21/21 0905  BP: (!) 129/93  Pulse: (!) 58  Resp: (!) 28  Temp: 98.4 F (36.9 C)  TempSrc: Oral  SpO2: 100%  Weight: (!) 337 lb 11.2 oz (153.2 kg)  Height: 5\' 4"  (1.626 m)   Physical Exam  Constitutional: Young, obese female. No distress.  HENT: Normocephalic and atraumatic, EOMI, conjunctiva normal, moist mucous membranes Cardiovascular: Normal rate, regular rhythm, S1 and S2 present, no murmurs, rubs, gallops.  Distal pulses intact Respiratory: No respiratory distress, Lungs are clear to auscultation bilaterally. Musculoskeletal: Normal bulk and tone.  No joint swelling, tenderness, warmth or edema noted. No peripheral edema noted. Neurological: Is alert and oriented x4, no apparent focal deficits noted. Skin: Warm and dry.  No rash, erythema, lesions noted. Psychiatric: Normal mood and affect. Behavior is normal.    Assessment & Plan:   See Encounters Tab for problem based charting.  Patient discussed with Dr. 

## 2021-02-21 NOTE — Patient Instructions (Addendum)
Valerie Green,  It was a pleasure seeing you in clinic. Today we discussed:   Chest pain:  At this time, I suspect this may be in setting of reflux disease. We can try Protonix 40mg  daily. Please let know if you continue to experience the chest pain  Diffuse joint pains: I am checking on some lab work today. I will call you with any abnormal results.   If you have any questions or concerns, please call our clinic at [phone] between 9am-5pm and after hours call [phone] and ask for the internal medicine resident on call. If you feel you are having a medical emergency please call 911.   Thank you, we look forward to helping you remain healthy!

## 2021-02-21 NOTE — Assessment & Plan Note (Signed)
Sinus bradycardia on EKG, similar to prior EKG from July 2022. Patient asymptomatic at this time.  Plan: Continue to monitor

## 2021-02-21 NOTE — Telephone Encounter (Signed)
Lft 2 msgs for Pt's IBH telehealth visit today. Requested Pt call [facility] & r/s @ her convenience since 80 hr/wk work schedule is intense for her.  Dr. Monna Fam

## 2021-02-22 LAB — SEDIMENTATION RATE: Sed Rate: 13 mm/hr (ref 0–32)

## 2021-02-22 LAB — C-REACTIVE PROTEIN: CRP: 19 mg/L — ABNORMAL HIGH (ref 0–10)

## 2021-02-26 NOTE — Progress Notes (Signed)
Internal Medicine Clinic Attending ° °Case discussed with Dr. Aslam  At the time of the visit.  We reviewed the resident’s history and exam and pertinent patient test results.  I agree with the assessment, diagnosis, and plan of care documented in the resident’s note.  °

## 2021-03-15 ENCOUNTER — Ambulatory Visit (INDEPENDENT_AMBULATORY_CARE_PROVIDER_SITE_OTHER): Payer: 59 | Admitting: Student

## 2021-03-15 ENCOUNTER — Encounter: Payer: Self-pay | Admitting: Student

## 2021-03-15 VITALS — BP 124/83 | HR 55 | Temp 98.0°F | Ht 64.0 in | Wt 322.9 lb

## 2021-03-15 DIAGNOSIS — F909 Attention-deficit hyperactivity disorder, unspecified type: Secondary | ICD-10-CM

## 2021-03-15 DIAGNOSIS — Z0000 Encounter for general adult medical examination without abnormal findings: Secondary | ICD-10-CM | POA: Diagnosis not present

## 2021-03-15 DIAGNOSIS — Z23 Encounter for immunization: Secondary | ICD-10-CM

## 2021-03-15 DIAGNOSIS — F988 Other specified behavioral and emotional disorders with onset usually occurring in childhood and adolescence: Secondary | ICD-10-CM | POA: Diagnosis not present

## 2021-03-15 MED ORDER — AMPHETAMINE-DEXTROAMPHET ER 10 MG PO CP24: 10.0000 mg | ORAL_CAPSULE | Freq: Every day | ORAL | 0 refills | Status: AC

## 2021-03-15 NOTE — Patient Instructions (Signed)
Thank you, Valerie Green for allowing [place] to provide your care today. Today we discussed .  ADD You have been screened and tested positive. We will be starting you on a controlled substance agreement with our clinic and starting you on Adderall.  I would like to see you in 2 weeks for follow-up.  If you find yourself having significant side effects from this medication please call our clinic immediately.  If you find that your depression anxiety worsens please call our clinic immediately.  I have ordered the following labs for you:  Lab Orders  No laboratory test(s) ordered today     Referrals ordered today:   Referral Orders  No referral(s) requested today     I have ordered the following medication/changed the following medications:   Stop the following medications: There are no discontinued medications.   Start the following medications: Meds ordered this encounter  Medications   amphetamine-dextroamphetamine (ADDERALL XR) 10 MG 24 hr capsule    Sig: Take 1 capsule (10 mg total) by mouth daily.    Dispense:  30 capsule    Refill:  0      Follow up: 2 weeks ADD follow-up    Should you have any questions or concerns please call the internal medicine clinic at [phone].    Thalia Bloodgood, D.O. [facility]

## 2021-03-16 DIAGNOSIS — F988 Other specified behavioral and emotional disorders with onset usually occurring in childhood and adolescence: Secondary | ICD-10-CM | POA: Insufficient documentation

## 2021-03-16 NOTE — Progress Notes (Signed)
   CC: inability to concentrate   HPI:  Valerie Green is a 35 y.o. female with a past medical history stated below and presents today for inability to concentrate. Please see problem based assessment and plan for additional details.  Past Medical History:  Diagnosis Date   ADD (attention deficit disorder)    Allergy    Anemia    Anxiety state 09/07/2015   Dysrhythmia    Gastritis    GERD (gastroesophageal reflux disease)    Headache    Pneumonia    Pre-diabetes     Current Outpatient Medications on File Prior to Visit  Medication Sig Dispense Refill   pantoprazole (PROTONIX) 40 MG tablet Take 1 tablet (40 mg total) by mouth daily. 90 tablet 3   No current facility-administered medications on file prior to visit.    Family History  Problem Relation Age of Onset   Hyperlipidemia Brother    Diabetes Paternal Uncle    Colon cancer Neg Hx    Esophageal cancer Neg Hx    Pancreatic cancer Neg Hx    Stomach cancer Neg Hx    Liver disease Neg Hx     Social History   Socioeconomic History   Marital status: Single    Spouse name: Not on file   Number of children: Not on file   Years of education: Not on file   Highest education level: Not on file  Occupational History   Not on file  Tobacco Use   Smoking status: Some Days   Smokeless tobacco: Former  Substance and Sexual Activity   Alcohol use: Yes    Alcohol/week: 0.0 standard drinks    Comment: occ   Drug use: No   Sexual activity: Not on file  Other Topics Concern   Not on file  Social History Narrative   Not on file   Social Determinants of Health   Financial Resource Strain: Not on file  Food Insecurity: Not on file  Transportation Needs: Not on file  Physical Activity: Not on file  Stress: Not on file  Social Connections: Not on file  Intimate Partner Violence: Not on file    Review of Systems: ROS negative except for what is noted on the assessment and plan.  Vitals:   03/15/21 1514   BP: 124/83  Pulse: (!) 55  Temp: 98 F (36.7 C)  TempSrc: Oral  SpO2: 99%  Weight: (!) 322 lb 14.4 oz (146.5 kg)  Height: 5\' 4"  (1.626 m)     Physical Exam: Constitutional: Well-appearing, no acute distress HENT: normocephalic atraumatic Eyes: conjunctiva non-erythematous Neck: supple Cardiovascular: regular rate Pulmonary/Chest: normal work of breathing on room air MSK: normal bulk and tone Neurological: alert & oriented x 3 Skin: warm and dry Psych: Normal mood and thought process   Assessment & Plan:   See Encounters Tab for problem based charting.  Patient discussed with Dr. , D.O. [facility] Internal Medicine, PGY-2 Pager: [phone], Phone: [phone] Date 03/16/2021 Time 4:24 PM

## 2021-03-16 NOTE — Assessment & Plan Note (Addendum)
Assessment: Patient states that she was diagnosed with ADD as a child..  During that time she was started on Ritalin.  This medication was continued until she was in the eighth grade.  It was stopped as when she would stop taking the medications for short period of time she would get debilitating migraines.  Since then she has not struggled with focusing.  However she recently started a new job and was doing well at first but now she is unable to keep up as she finds her self losing focus and being unable to concentrate.  On her adult ADHD self reporting scale, patient tested positive and per the instructions patient has symptoms highly consistent with attention deficit disorder.  We will restart patient on treatment however we will start Adderall 10 mg XR.  Patient has filled out a controlled medication treatment agreement.  This was reviewed with her in detail.  UDS also ordered today.  We will follow-up with patient in 2 weeks to see how she is doing.  Plan: -Start Adderall XR 10 mg, controlled medication agreement form signed.  PDMP reviewed and appropriate. -UDS pending -Follow-up in 2 weeks  Addendum:  UDS Unremarkable

## 2021-03-16 NOTE — Assessment & Plan Note (Signed)
Influenza shot given today 

## 2021-03-22 LAB — TOXASSURE SELECT,+ANTIDEPR,UR

## 2021-03-22 NOTE — Progress Notes (Signed)
Internal Medicine Clinic Attending  Case discussed with Dr. Katsadouros  At the time of the visit.  We reviewed the resident's history and exam and pertinent patient test results.  I agree with the assessment, diagnosis, and plan of care documented in the resident's note.  

## 2021-04-04 ENCOUNTER — Encounter: Payer: 59 | Admitting: Student

## 2021-08-22 ENCOUNTER — Encounter (HOSPITAL_COMMUNITY): Payer: Self-pay | Admitting: Emergency Medicine

## 2021-08-22 ENCOUNTER — Other Ambulatory Visit: Payer: Self-pay

## 2021-08-22 ENCOUNTER — Emergency Department (HOSPITAL_COMMUNITY): Payer: 59

## 2021-08-22 ENCOUNTER — Emergency Department (HOSPITAL_COMMUNITY)
Admission: EM | Admit: 2021-08-22 | Discharge: 2021-08-22 | Disposition: A | Discharge status: Home / Self Care | Payer: 59 | Attending: Emergency Medicine | Admitting: Emergency Medicine

## 2021-08-22 DIAGNOSIS — J069 Acute upper respiratory infection, unspecified: Secondary | ICD-10-CM | POA: Diagnosis not present

## 2021-08-22 DIAGNOSIS — J040 Acute laryngitis: Secondary | ICD-10-CM | POA: Insufficient documentation

## 2021-08-22 DIAGNOSIS — Z20822 Contact with and (suspected) exposure to covid-19: Secondary | ICD-10-CM | POA: Diagnosis not present

## 2021-08-22 DIAGNOSIS — J029 Acute pharyngitis, unspecified: Secondary | ICD-10-CM | POA: Diagnosis present

## 2021-08-22 LAB — RESP PANEL BY RT-PCR (FLU A&B, COVID) ARPGX2
Influenza A by PCR: NEGATIVE
Influenza B by PCR: NEGATIVE
SARS Coronavirus 2 by RT PCR: NEGATIVE

## 2021-08-22 MED ORDER — BENZONATATE 100 MG PO CAPS: 100.0000 mg | ORAL_CAPSULE | Freq: Three times a day (TID) | ORAL | 0 refills | Status: AC

## 2021-08-22 NOTE — Discharge Instructions (Addendum)
Read the information about laryngitis attached to these discharge papers.  The best treatment for this is resting your voice. ? ?Continue to take Zyrtec every day and use throat lozenges and honey for your sore throat.  Please return with any worsening symptoms. ?

## 2021-08-22 NOTE — ED Provider Triage Note (Signed)
Emergency Medicine Provider Triage Evaluation Note ? ?Valerie Green , a 36 y.o. female  was evaluated in triage.  Pt complains of cough, voice hoarseness, sore throat and congestion for a week.  Called the nurse line for PCP today and they said because there is sometimes a pink tinge to the mucus she coughs up that she needed to come to the emergency department. ? ?Review of Systems  ?Positive: As above ?Negative: Chest pain and shortness of breath ? ?Physical Exam  ?BP (!) 144/102 (BP Location: Right Arm)   Pulse 63   Temp 98.7 ?F (37.1 ?C) (Oral)   Resp 18   Ht 5\' 4"  (1.626 m)   Wt (!) 146.1 kg   LMP 07/19/2021   SpO2 100%   BMI 55.27 kg/m?  ?Gen:   Awake, no distress   ?Resp:  Normal effort  ?MSK:   Moves extremities without difficulty  ?Other:  RRR, CTA B ? ?Medical Decision Making  ?Medically screening exam initiated at 6:38 PM.  Appropriate orders placed.  Valerie Green was informed that the remainder of the evaluation will be completed by another provider, this initial triage assessment does not replace that evaluation, and the importance of remaining in the ED until their evaluation is complete. ? ? ?  ?Rhae Hammock, PA-C ?08/22/21 1840 ? ?

## 2021-08-22 NOTE — ED Provider Notes (Signed)
?[facility] ?Provider Note ? ? ?CSN: [id number] ?Arrival date & time: 08/22/21  1803 ? ?  ? ?History ? ?Chief Complaint  ?Patient presents with  ? Cough  ? ? ?Valerie Green is a 36 y.o. female with a past medical history of anxiety and obesity presenting today with complaint of sore throat, voice hoarseness and cough.  She says everything started with a sore throat last Wednesday.  Denies fever or chills but says that her cough over the last few days has occasionally been productive.  She called her PCP and they asked her about her cough and when she stated that it sometimes had a pink tinge to it they sent her to the emergency department for an x-ray.  Denies chest pain or trouble breathing.  No recent travel, surgery, history of DVT/PE, OCPs. ? ? ?Cough ?Associated symptoms: sore throat   ?Associated symptoms: no chest pain, no chills, no fever and no shortness of breath   ? ?  ? ?Home Medications ?Prior to Admission medications   ?Medication Sig Start Date End Date Taking? Authorizing Provider  ?amphetamine-dextroamphetamine (ADDERALL XR) 10 MG 24 hr capsule Take 1 capsule (10 mg total) by mouth daily. 03/15/21 04/14/21  Riesa Pope, MD  ?benzonatate (TESSALON) 100 MG capsule Take 1 capsule (100 mg total) by mouth every 8 (eight) hours. 08/22/21  Yes Aalaya Yadao A, PA-C  ?pantoprazole (PROTONIX) 40 MG tablet Take 1 tablet (40 mg total) by mouth daily. 02/21/21 02/21/22  Harvie Heck, MD  ?   ? ?Allergies    ?Coffee bean extract [coffea arabica], Other, Lactalbumin, Milk-related compounds, and Penicillins   ? ?Review of Systems   ?Review of Systems  ?Constitutional:  Negative for chills and fever.  ?HENT:  Positive for sore throat and voice change. Negative for trouble swallowing.   ?Respiratory:  Positive for cough. Negative for shortness of breath.   ?Cardiovascular:  Negative for chest pain and palpitations.  ? ?Physical Exam ?Updated Vital Signs ?BP  (!) 148/94 (BP Location: Right Arm)   Pulse 70   Temp 98.6 ?F (37 ?C) (Oral)   Resp 19   Ht 5\' 4"  (1.626 m)   Wt (!) 146.1 kg   LMP 07/19/2021   SpO2 100%   BMI 55.27 kg/m?  ?Physical Exam ?Vitals and nursing note reviewed.  ?Constitutional:   ?   General: She is not in acute distress. ?   Appearance: Normal appearance.  ?HENT:  ?   Head: Normocephalic and atraumatic.  ?   Right Ear: Tympanic membrane normal.  ?   Left Ear: Tympanic membrane normal.  ?   Nose: Nose normal.  ?   Mouth/Throat:  ?   Mouth: Mucous membranes are moist.  ?   Pharynx: Oropharynx is clear. No oropharyngeal exudate.  ?Eyes:  ?   General: No scleral icterus. ?   Conjunctiva/sclera: Conjunctivae normal.  ?Cardiovascular:  ?   Rate and Rhythm: Normal rate and regular rhythm.  ?Pulmonary:  ?   Effort: Pulmonary effort is normal. No respiratory distress.  ?   Breath sounds: No wheezing.  ?Skin: ?   Findings: No rash.  ?Neurological:  ?   Mental Status: She is alert.  ?Psychiatric:     ?   Mood and Affect: Mood normal.  ? ? ?ED Results / Procedures / Treatments   ?Labs ?(all labs ordered are listed, but only abnormal results are displayed) ?Labs Reviewed  ?RESP PANEL BY RT-PCR (FLU A&B, COVID)  ARPGX2  ? ? ?EKG ?None ? ?Radiology ?DG Chest 2 View ? ?Result Date: 08/22/2021 ?CLINICAL DATA:  Productive cough, pink  mucous EXAM: CHEST - 2 VIEW COMPARISON:  None. FINDINGS: Normal mediastinum and cardiac silhouette. Normal pulmonary vasculature. No evidence of effusion, infiltrate, or pneumothorax. No acute bony abnormality. IMPRESSION: No acute cardiopulmonary process. Electronically Signed   By: Suzy Bouchard M.D.   On: 08/22/2021 19:20   ? ?Procedures ?Procedures  ? ?Medications Ordered in ED ?Medications - No data to display ? ?ED Course/ Medical Decision Making/ A&P ?  ?                        ?Medical Decision Making ?Amount and/or Complexity of Data Reviewed ?Radiology: ordered. ? ?Risk ?Prescription drug management. ? ? ?36 year old  female presenting with sore throat, hoarseness about a week.  Called PCP who said she needed to come get a chest x-ray due to pink-tinged her mucus. ? ?Physical exam: Patient does not appear ill however voice is hoarse.  Airway was clear, tolerating secretions.  No signs of abscess.  Lung sounds clear. ? ?Imaging: Chest x-ray ordered, viewed and individually interpreted by me.  No signs of pneumonia.  Radiologist with negative read ? ?MDM/disposition: I believe patient is experiencing laryngitis, likely secondary to a viral illness.  Low suspicion PE, patient is PERC negative.  She will be discharged home with information about laryngitis, over-the-counter treatments and Tessalon Perles.  She is agreeable to this.  We discussed return precautions at length.  She was supplied with a work note, thankful for her care and ambulated out of the department ? ? ?Final Clinical Impression(s) / ED Diagnoses ?Final diagnoses:  ?Viral URI with cough  ?Laryngitis  ? ? ?Rx / DC Orders ?ED Discharge Orders   ? ?      Ordered  ?  benzonatate (TESSALON) 100 MG capsule  Every 8 hours       ? 08/22/21 2106  ? ?  ?  ? ?  ? ?Results and diagnoses were explained to the patient. Return precautions discussed in full. Patient had no additional questions and expressed complete understanding. ? ? ?This chart was dictated using voice recognition software.  Despite best efforts to proofread,  errors can occur which can change the documentation meaning.  ?  ?Rhae Hammock, PA-C ?08/22/21 2125 ? ?  ?Malvin Johns, MD ?08/23/21 0008 ? ?

## 2021-08-22 NOTE — ED Triage Notes (Signed)
Patient endorses cough with white mucus onset of last Wednesday. Symptoms started with a scratchy throat and hoarse voice.  ?

## 2021-11-26 ENCOUNTER — Ambulatory Visit (HOSPITAL_COMMUNITY)
Admission: EM | Admit: 2021-11-26 | Discharge: 2021-11-26 | Disposition: A | Discharge status: Home / Self Care | Payer: 59 | Attending: Emergency Medicine | Admitting: Emergency Medicine

## 2021-11-26 ENCOUNTER — Encounter (HOSPITAL_COMMUNITY): Payer: Self-pay | Admitting: Emergency Medicine

## 2021-11-26 DIAGNOSIS — J069 Acute upper respiratory infection, unspecified: Secondary | ICD-10-CM | POA: Diagnosis not present

## 2021-11-26 MED ORDER — DOXYCYCLINE HYCLATE 100 MG PO CAPS: 100.0000 mg | ORAL_CAPSULE | Freq: Two times a day (BID) | ORAL | 0 refills | Status: AC

## 2021-11-26 NOTE — ED Triage Notes (Signed)
Patient c/o head cold, congestion for several weeks, today right ear pain messing with her overall balance.  Patient has taken OTC Robitussin Cough/Cold, Affrin, Excedrin, Tylenol.

## 2021-11-26 NOTE — ED Provider Notes (Signed)
[facility]    CSN: [id number] Arrival date & time: 11/26/21  1901      History   Chief Complaint Chief Complaint  Patient presents with   Otalgia    HPI Valerie Green is a 36 y.o. female.   Presents with a productive cough , nasal congestion and rhinorrhea beginning for weeks ago, endorses right-sided ear fullness beginning today.  Has attempted use of peroxide into the ear which has been ineffective, for the congestion she has attempted use of Robitussin, Afrin, Excedrin, Tylenol which have all been helpful.  Tolerating food and liquids.  No known sick contacts.  Denies fever, chills, body aches, sore throat, headaches, shortness of breath or wheezing.  History of allergies, GERD, pneumonia.   Past Medical History:  Diagnosis Date   ADD (attention deficit disorder)    Allergy    Anemia    Anxiety state 09/07/2015   Dysrhythmia    Gastritis    GERD (gastroesophageal reflux disease)    Headache    Pneumonia    Pre-diabetes     Patient Active Problem List   Diagnosis Date Noted   ADD (attention deficit disorder) 03/16/2021   Chest pain 02/21/2021   Polyarthralgia 02/21/2021   Sinus bradycardia on ECG 02/21/2021   Prediabetes 12/06/2020   Major depressive disorder 12/06/2020   Elevated TSH 12/06/2020   Healthcare maintenance 12/06/2020   Severe obesity (BMI >= 40) (HCC) 01/19/2016   S/P laparoscopic cholecystectomy August 2017 12/23/2015   Generalized anxiety disorder 09/07/2015    Past Surgical History:  Procedure Laterality Date   LAPAROSCOPIC CHOLECYSTECTOMY SINGLE SITE WITH INTRAOPERATIVE CHOLANGIOGRAM N/A 12/22/2015   Procedure: LAPAROSCOPIC CHOLECYSTECTOMY WITH INTRAOPERATIVE CHOLANGIOGRAM;  Surgeon: Ovidio Kin, MD;  Location: [facility] OR;  Service: General;  Laterality: N/A;    OB History     Gravida  1   Para      Term      Preterm      AB      Living         SAB      IAB      Ectopic      Multiple      Live Births                Home Medications    Prior to Admission medications   Medication Sig Start Date End Date Taking? Authorizing Provider  amphetamine-dextroamphetamine (ADDERALL XR) 10 MG 24 hr capsule Take 1 capsule (10 mg total) by mouth daily. 03/15/21 04/14/21  Belva Agee, MD  benzonatate (TESSALON) 100 MG capsule Take 1 capsule (100 mg total) by mouth every 8 (eight) hours. 08/22/21   Redwine, Madison A, PA-C  pantoprazole (PROTONIX) 40 MG tablet Take 1 tablet (40 mg total) by mouth daily. 02/21/21 02/21/22  Eliezer Bottom, MD    Family History Family History  Problem Relation Age of Onset   Hyperlipidemia Brother    Diabetes Paternal Uncle    Colon cancer Neg Hx    Esophageal cancer Neg Hx    Pancreatic cancer Neg Hx    Stomach cancer Neg Hx    Liver disease Neg Hx     Social History Social History   Tobacco Use   Smoking status: Some Days   Smokeless tobacco: Former  Substance Use Topics   Alcohol use: Yes    Alcohol/week: 0.0 standard drinks of alcohol    Comment: occ   Drug use: No     Allergies   Coffee  bean extract [coffea arabica], Other, Lactalbumin, Milk-related compounds, and Penicillins   Review of Systems Review of Systems  Constitutional: Negative.   HENT:  Positive for congestion, ear pain and rhinorrhea. Negative for dental problem, drooling, ear discharge, facial swelling, hearing loss, mouth sores, nosebleeds, postnasal drip, sinus pressure, sinus pain, sneezing, sore throat, tinnitus, trouble swallowing and voice change.   Respiratory:  Positive for cough. Negative for apnea, choking, chest tightness, shortness of breath, wheezing and stridor.   Cardiovascular: Negative.   Gastrointestinal: Negative.   Neurological: Negative.      Physical Exam Triage Vital Signs ED Triage Vitals  Enc Vitals Group     BP 11/26/21 1957 136/87     Pulse Rate 11/26/21 1957 (!) 54     Resp 11/26/21 1957 18     Temp 11/26/21 1957 98.6 F (37 C)      Temp Source 11/26/21 1957 Oral     SpO2 11/26/21 1957 98 %     Weight --      Height 11/26/21 1958 5\' 4"  (1.626 m)     Head Circumference --      Peak Flow --      Pain Score 11/26/21 1958 0     Pain Loc --      Pain Edu? --      Excl. in GC? --    No data found.  Updated Vital Signs BP 136/87 (BP Location: Right Arm)   Pulse (!) 54   Temp 98.6 F (37 C) (Oral)   Resp 18   Ht 5\' 4"  (1.626 m)   LMP 11/06/2021   SpO2 98%   Breastfeeding No   BMI 55.27 kg/m   Visual Acuity Right Eye Distance:   Left Eye Distance:   Bilateral Distance:    Right Eye Near:   Left Eye Near:    Bilateral Near:     Physical Exam Constitutional:      Appearance: Normal appearance.  HENT:     Head: Normocephalic.     Right Ear: Tympanic membrane, ear canal and external ear normal.     Left Ear: Tympanic membrane, ear canal and external ear normal.     Nose: Congestion and rhinorrhea present.     Mouth/Throat:     Mouth: Mucous membranes are moist.     Pharynx: Oropharynx is clear.  Eyes:     Extraocular Movements: Extraocular movements intact.  Cardiovascular:     Rate and Rhythm: Normal rate and regular rhythm.     Pulses: Normal pulses.     Heart sounds: Normal heart sounds.  Pulmonary:     Effort: Pulmonary effort is normal.     Breath sounds: Normal breath sounds.  Musculoskeletal:     Cervical back: Normal range of motion and neck supple.  Skin:    General: Skin is warm and dry.  Neurological:     Mental Status: She is alert and oriented to person, place, and time. Mental status is at baseline.  Psychiatric:        Mood and Affect: Mood normal.        Behavior: Behavior normal.      UC Treatments / Results  Labs (all labs ordered are listed, but only abnormal results are displayed) Labs Reviewed - No data to display  EKG   Radiology No results found.  Procedures Procedures (including critical care time)  Medications Ordered in UC Medications - No data to  display  Initial Impression / Assessment and Plan /  UC Course  I have reviewed the triage vital signs and the nursing notes.  Pertinent labs & imaging results that were available during my care of the patient were reviewed by me and considered in my medical decision making (see chart for details).  Upper respiratory tract infection  No abnormalities are noted to the bilateral ears, discussed with patient however as cough and congestion have been present for 3 to 4 weeks we will provide bacterial coverage for sinusitis, doxycycline 10-day course prescribed as allergy to penicillin confirmed, advised beginning use of Flonase,  may continue use of over-the-counter medications for additional supportive care measures, may follow-up with urgent care as needed, work note given Final Clinical Impressions(s) / UC Diagnoses   Final diagnoses:  None   Discharge Instructions   None    ED Prescriptions   None    PDMP not reviewed this encounter.   Valinda Hoar, [place] 11/28/21 [phone]

## 2021-11-26 NOTE — Discharge Instructions (Signed)
Your symptoms most likely initially began as a virus but as they have been present for 4 weeks we will provide bacterial coverage which is most likely exacerbating her illness  Take doxycycline every morning and every evening for the next 10 days, ideally you will begin to see improvement in about 48 hours  Continue use of current over-the-counter medications if you have deemed them helpful to help minimize your congestion, I would recommend that you attempt use of Flonase nasal spray to help minimize her ear pain You can take Tylenol and/or Ibuprofen as needed for fever reduction and pain relief.   For cough: honey 1/2 to 1 teaspoon (you can dilute the honey in water or another fluid).  You can also use guaifenesin and dextromethorphan for cough. You can use a humidifier for chest congestion and cough.  If you don't have a humidifier, you can sit in the bathroom with the hot shower running.      For sore throat: try warm salt water gargles, cepacol lozenges, throat spray, warm tea or water with lemon/honey, popsicles or ice, or OTC cold relief medicine for throat discomfort.   For congestion: take a daily anti-histamine like Zyrtec, Claritin, and a oral decongestant, such as pseudoephedrine.  You can also use Flonase 1-2 sprays in each nostril daily.   It is important to stay hydrated: drink plenty of fluids (water, gatorade/powerade/pedialyte, juices, or teas) to keep your throat moisturized and help further relieve irritation/discomfort.

## 2022-02-21 ENCOUNTER — Emergency Department (HOSPITAL_COMMUNITY)
Admission: EM | Admit: 2022-02-21 | Discharge: 2022-02-21 | Disposition: A | Discharge status: Home / Self Care | Payer: 59 | Attending: Emergency Medicine | Admitting: Emergency Medicine

## 2022-02-21 ENCOUNTER — Encounter (HOSPITAL_COMMUNITY): Payer: Self-pay

## 2022-02-21 ENCOUNTER — Other Ambulatory Visit: Payer: Self-pay

## 2022-02-21 DIAGNOSIS — L049 Acute lymphadenitis, unspecified: Secondary | ICD-10-CM | POA: Diagnosis not present

## 2022-02-21 DIAGNOSIS — M542 Cervicalgia: Secondary | ICD-10-CM | POA: Diagnosis present

## 2022-02-21 DIAGNOSIS — I889 Nonspecific lymphadenitis, unspecified: Secondary | ICD-10-CM

## 2022-02-21 NOTE — ED Triage Notes (Signed)
Pt reports two "knots" on right neck x few days. "Hurts when I turn my head"

## 2022-02-21 NOTE — ED Provider Notes (Signed)
[facility]-EMERGENCY DEPT Provider Note  CSN: [id number] Arrival date & time: 02/21/22 0010  Chief Complaint(s) Neck Pain  HPI Valerie Green is a 36 y.o. female    The history is provided by the patient.  Neck Pain Pain location:  R side Quality:  Aching Pain severity:  Mild Onset quality:  Gradual Duration: several days. Timing:  Constant Progression:  Unchanged Chronicity:  New Context comment:  Reports that she had mild sore throat and white plaque on tonsils a couple of weeks ago Relieved by:  Nothing Exacerbated by: touching. Associated symptoms: no fever and no headaches     Past Medical History Past Medical History:  Diagnosis Date   ADD (attention deficit disorder)    Allergy    Anemia    Anxiety state 09/07/2015   Dysrhythmia    Gastritis    GERD (gastroesophageal reflux disease)    Headache    Pneumonia    Pre-diabetes    Patient Active Problem List   Diagnosis Date Noted   ADD (attention deficit disorder) 03/16/2021   Chest pain 02/21/2021   Polyarthralgia 02/21/2021   Sinus bradycardia on ECG 02/21/2021   Prediabetes 12/06/2020   Major depressive disorder 12/06/2020   Elevated TSH 12/06/2020   Healthcare maintenance 12/06/2020   Severe obesity (BMI >= 40) (HCC) 01/19/2016   S/P laparoscopic cholecystectomy August 2017 12/23/2015   Generalized anxiety disorder 09/07/2015   Home Medication(s) Prior to Admission medications   Medication Sig Start Date End Date Taking? Authorizing Provider  amphetamine-dextroamphetamine (ADDERALL XR) 10 MG 24 hr capsule Take 1 capsule (10 mg total) by mouth daily. Patient not taking: Reported on 02/21/2022 03/15/21 04/14/21  Belva Agee, MD  benzonatate (TESSALON) 100 MG capsule Take 1 capsule (100 mg total) by mouth every 8 (eight) hours. Patient not taking: Reported on 02/21/2022 08/22/21   Redwine, Madison A, PA-C  pantoprazole (PROTONIX) 40 MG tablet Take 1 tablet (40 mg  total) by mouth daily. Patient not taking: Reported on 02/21/2022 02/21/21 02/21/22  Eliezer Bottom, MD                                                                                                                                    Allergies Coffee bean extract [coffea arabica], Other, Lactalbumin, Milk-related compounds, and Penicillins  Review of Systems Review of Systems  Constitutional:  Negative for fever.  Musculoskeletal:  Positive for neck pain.  Neurological:  Negative for headaches.   As noted in HPI  Physical Exam Vital Signs  I have reviewed the triage vital signs BP (!) 149/96   Pulse 74   Temp 99.6 F (37.6 C) (Oral)   Resp 18   Ht 5\' 4"  (1.626 m)   Wt (!) 145.2 kg   LMP 02/20/2022 (Exact Date)   SpO2 100%   BMI 54.93 kg/m   Physical Exam Vitals reviewed.  Constitutional:      General:  She is not in acute distress.    Appearance: She is well-developed. She is not diaphoretic.  HENT:     Head: Normocephalic and atraumatic.     Right Ear: Tympanic membrane normal.     Left Ear: Tympanic membrane normal.     Nose: Nose normal.     Mouth/Throat:     Lips: No lesions.     Mouth: No oral lesions.     Dentition: No gum lesions.     Palate: No lesions.     Pharynx: No pharyngeal swelling, oropharyngeal exudate or posterior oropharyngeal erythema.     Tonsils: No tonsillar exudate or tonsillar abscesses.  Eyes:     General: No scleral icterus.       Right eye: No discharge.        Left eye: No discharge.     Conjunctiva/sclera: Conjunctivae normal.     Pupils: Pupils are equal, round, and reactive to light.  Cardiovascular:     Rate and Rhythm: Normal rate and regular rhythm.     Heart sounds: No murmur heard.    No friction rub. No gallop.  Pulmonary:     Effort: Pulmonary effort is normal. No respiratory distress.     Breath sounds: Normal breath sounds. No stridor. No rales.  Abdominal:     General: There is no distension.     Palpations: Abdomen  is soft.     Tenderness: There is no abdominal tenderness.  Musculoskeletal:        General: No tenderness.     Cervical back: Normal range of motion and neck supple.  Lymphadenopathy:     Head:     Right side of head: Tonsillar and occipital adenopathy present. No submental, submandibular, preauricular or posterior auricular adenopathy.     Left side of head: No submental, submandibular, tonsillar, preauricular, posterior auricular or occipital adenopathy.     Cervical: Cervical adenopathy present.     Right cervical: Superficial cervical adenopathy and posterior cervical adenopathy present.     Left cervical: No superficial, deep or posterior cervical adenopathy.     Upper Body:     Right upper body: No supraclavicular or axillary adenopathy.     Left upper body: No supraclavicular or axillary adenopathy.  Skin:    General: Skin is warm and dry.     Findings: No erythema or rash.  Neurological:     Mental Status: She is alert and oriented to person, place, and time.     ED Results and Treatments Labs (all labs ordered are listed, but only abnormal results are displayed) Labs Reviewed - No data to display                                                                                                                       EKG  EKG Interpretation  Date/Time:    Ventricular Rate:    PR Interval:    QRS Duration:   QT Interval:    QTC Calculation:  R Axis:     Text Interpretation:         Radiology No results found.  Medications Ordered in ED Medications - No data to display                                                                                                                                   Procedures Procedures  (including critical care time)  Medical Decision Making / ED Course   Medical Decision Making   Patient presents with tender right sided cervical and occipital LAD. No other LAD noted. No current infection. Possible recent oral  infection. Discussed monitoring vs getting labs. With shared decision making, patient opted for monitoring and returning in 2-3 weeks if LAD does not resolve or continues to spread. Encouraged finding a PCP as well.      Final Clinical Impression(s) / ED Diagnoses Final diagnoses:  Lymphadenitis   The patient appears reasonably screened and/or stabilized for discharge and I doubt any other medical condition or other [facility] requiring further screening, evaluation, or treatment in the ED at this time. I have discussed the findings, Dx and Tx plan with the patient/family who expressed understanding and agree(s) with the plan. Discharge instructions discussed at length. The patient/family was given strict return precautions who verbalized understanding of the instructions. No further questions at time of discharge.  Disposition: Discharge  Condition: Good  ED Discharge Orders     None        Follow Up: Primary care provider   if you do not have a primary care physician, contact [facility] at [phone] for referral           This chart was dictated using voice recognition software.  Despite best efforts to proofread,  errors can occur which can change the documentation meaning.    Fatima Blank, MD 02/23/22 [phone]

## 2022-03-23 ENCOUNTER — Other Ambulatory Visit: Payer: Self-pay

## 2022-03-23 ENCOUNTER — Emergency Department (HOSPITAL_COMMUNITY)
Admission: EM | Admit: 2022-03-23 | Discharge: 2022-03-23 | Disposition: A | Discharge status: Home / Self Care | Payer: 59 | Attending: Emergency Medicine | Admitting: Emergency Medicine

## 2022-03-23 ENCOUNTER — Encounter (HOSPITAL_COMMUNITY): Payer: Self-pay | Admitting: Emergency Medicine

## 2022-03-23 DIAGNOSIS — M545 Low back pain, unspecified: Secondary | ICD-10-CM | POA: Diagnosis present

## 2022-03-23 LAB — I-STAT BETA HCG BLOOD, ED (MC, WL, AP ONLY): I-stat hCG, quantitative: <5 m[IU]/mL (ref <5)

## 2022-03-23 MED ORDER — NAPROXEN 500 MG PO TABS: 500.0000 mg | ORAL_TABLET | Freq: Two times a day (BID) | ORAL | 0 refills | Status: AC

## 2022-03-23 MED ORDER — METHOCARBAMOL 500 MG PO TABS: 750.0000 mg | ORAL_TABLET | Freq: Two times a day (BID) | ORAL | 0 refills | Status: AC

## 2022-03-23 MED ORDER — METHOCARBAMOL 500 MG PO TABS
1000.0000 mg | ORAL_TABLET | Freq: Once | ORAL | Status: AC
  Administered 2022-03-23: 1000 mg via ORAL
  Filled 2022-03-23: qty 2

## 2022-03-23 MED ORDER — KETOROLAC TROMETHAMINE 30 MG/ML IJ SOLN: 30.0000 mg | Freq: Once | INTRAMUSCULAR | Status: DC

## 2022-03-23 MED ORDER — KETOROLAC TROMETHAMINE 30 MG/ML IJ SOLN
30.0000 mg | Freq: Once | INTRAMUSCULAR | Status: AC
  Administered 2022-03-23: 30 mg via INTRAVENOUS
  Filled 2022-03-23: qty 1

## 2022-03-23 NOTE — ED Provider Notes (Signed)
[place] EMERGENCY DEPARTMENT Provider Note   CSN: [id number] Arrival date & time: 03/23/22  0846     History History of obesity, prediabetes, anxiety, depression Chief Complaint  Patient presents with   Back Pain    Valerie Green is a 36 y.o. female.  Presents with right lower back pain that started yesterday.  She is not sure what she was doing when it started.  Pain is 6 out of 10 in intensity.  Pain is worse when bending over.  It feels like a cramp.  She says she took ibuprofen once as well as 5 mg of Flexeril without any relief.  She says she had these pains before, but this is more severe than normal.  She denies any bowel or bladder dysfunction, numbness or weakness of lower extremity, dysuria, hematuria, flank pain, fevers, chills, vaginal discharge or bleeding, abdominal pain, nausea, vomiting, diarrhea, or constipation.   Back Pain      Home Medications Prior to Admission medications   Medication Sig Start Date End Date Taking? Authorizing Provider  methocarbamol (ROBAXIN) 500 MG tablet Take 1.5 tablets (750 mg total) by mouth 2 (two) times daily for 7 days. 03/23/22 03/30/22 Yes Vianny Schraeder, Adora Fridge, PA-C  naproxen (NAPROSYN) 500 MG tablet Take 1 tablet (500 mg total) by mouth 2 (two) times daily for 7 days. 03/23/22 03/30/22 Yes Issabella Rix, Adora Fridge, PA-C  amphetamine-dextroamphetamine (ADDERALL XR) 10 MG 24 hr capsule Take 1 capsule (10 mg total) by mouth daily. Patient not taking: Reported on 02/21/2022 03/15/21 04/14/21  Riesa Pope, MD  benzonatate (TESSALON) 100 MG capsule Take 1 capsule (100 mg total) by mouth every 8 (eight) hours. Patient not taking: Reported on 02/21/2022 08/22/21   Redwine, Madison A, PA-C  pantoprazole (PROTONIX) 40 MG tablet Take 1 tablet (40 mg total) by mouth daily. Patient not taking: Reported on 02/21/2022 02/21/21 02/21/22  Harvie Heck, MD      Allergies    Coffee bean extract [coffea arabica],  Other, Lactalbumin, Milk-related compounds, and Penicillins    Review of Systems   Review of Systems  Musculoskeletal:  Positive for back pain.  All other systems reviewed and are negative.   Physical Exam Updated Vital Signs BP (!) 153/86 (BP Location: Right Arm)   Pulse 87   Temp 99.1 F (37.3 C)   Resp 18   Ht 5\' 4"  (1.626 m)   Wt 134.7 kg   LMP 02/20/2022 (Exact Date)   SpO2 96%   BMI 50.98 kg/m  Physical Exam Vitals and nursing note reviewed.  Constitutional:      General: She is not in acute distress.    Appearance: Normal appearance. She is well-developed. She is not ill-appearing, toxic-appearing or diaphoretic.  HENT:     Head: Normocephalic and atraumatic.     Nose: No nasal deformity.     Mouth/Throat:     Lips: Pink. No lesions.  Eyes:     General: Gaze aligned appropriately. No scleral icterus.       Right eye: No discharge.        Left eye: No discharge.     Conjunctiva/sclera: Conjunctivae normal.     Right eye: Right conjunctiva is not injected. No exudate or hemorrhage.    Left eye: Left conjunctiva is not injected. No exudate or hemorrhage. Pulmonary:     Effort: Pulmonary effort is normal. No respiratory distress.  Abdominal:     General: Abdomen is flat. There is no distension.  Palpations: Abdomen is soft.     Tenderness: There is no abdominal tenderness. There is no right CVA tenderness, left CVA tenderness, guarding or rebound.  Musculoskeletal:     Right lower leg: No edema.     Left lower leg: No edema.     Comments: No midline tenderness of spine, no stepoff or deformity; reproducible muscular tenderness in paraspinal muscles DP/PT pulses 2+ and equal bilaterally No leg edema Sensation grossly intact on anterior thighs, dorsum of foot and lateral foot Strength of knee flexion and extension is 5/5 Plantar and dorsiflexion of ankle 5/5 patellar reflexes present and equal Gait normal   Skin:    General: Skin is warm and dry.   Neurological:     Mental Status: She is alert and oriented to person, place, and time.  Psychiatric:        Mood and Affect: Mood normal.        Speech: Speech normal.        Behavior: Behavior normal. Behavior is cooperative.     ED Results / Procedures / Treatments   Labs (all labs ordered are listed, but only abnormal results are displayed) Labs Reviewed  URINALYSIS, ROUTINE W REFLEX MICROSCOPIC  I-STAT BETA HCG BLOOD, ED ([facility], [facility], [facility] ONLY)    EKG None  Radiology No results found.  Procedures Procedures   Medications Ordered in ED Medications  methocarbamol (ROBAXIN) tablet 1,000 mg (1,000 mg Oral Given 03/23/22 1137)  ketorolac (TORADOL) 30 MG/ML injection 30 mg (30 mg Intravenous Given 03/23/22 1137)    ED Course/ Medical Decision Making/ A&P                           Medical Decision Making Amount and/or Complexity of Data Reviewed Labs: ordered.  Risk Prescription drug management.   Patient is presenting with lower back pain that started yesterday.  She has stable vitals and is in no acute distress.  She has no red flag lower back pain symptoms.  I have low suspicion for urinary or renal pathology.  Suspect muscular in etiology.  Her symptoms have immensely improved after Robaxin and Toradol.  She does not require admission at this time.  Will prescribe her naproxen and Robaxin at discharge.  She will follow-up with her PCP.  Return precautions provided.   Final Clinical Impression(s) / ED Diagnoses Final diagnoses:  Acute right-sided low back pain without sciatica    Rx / DC Orders ED Discharge Orders          Ordered    methocarbamol (ROBAXIN) 500 MG tablet  2 times daily        03/23/22 1234    naproxen (NAPROSYN) 500 MG tablet  2 times daily        03/23/22 1234              Josua Ferrebee, Finis Bud, PA-C 03/23/22 1317    Vanetta Mulders, MD 03/26/22 1542

## 2022-03-23 NOTE — ED Triage Notes (Signed)
Pt BIB [facility] due to lower back pain.  Pt reports it feels like spasms.  Pt reports she took muscle relaxers prescribed with no relief.  Only time it is tolerable is when she is laying flat.  20g in left AC.

## 2022-03-23 NOTE — Discharge Instructions (Signed)
You were given a prescription for Robaxin which is a muscle relaxer.  You should not drive, work, consume alcohol, or operate machinery while taking this medication as it can make you very drowsy.     You have also been prescribed Naproxen.   Please stop taking ibuprofen and your other muscle relaxer at home.   Back Pain:  Back pain is very common.  The pain often gets better over time.  The cause of back pain is usually not dangerous.  Most people can learn to manage their back pain on their own.  However if you develop severe or worsening pain, low back pain with fever, numbness, weakness or inability to walk or urinate/stool, you should return to the ER immediately.  Please follow up with your doctor this week for a recheck if still having symptoms.  Low back pain is discomfort in the lower back that may be due to injuries to muscles and ligaments around the spine.  Occasionally, it may be caused by a a problem to a part of the spine called a disc. The pain may last several days or a week;  However, most patients get completely well in 4 weeks.  Medications are also useful to help with pain control.  A commonly prescribed medications includes acetaminophen.  This medication is generally safe, though you should not take more than 8 of the extra strength (500mg ) pills a day.  Non steroidal anti inflammatory medications including Ibuprofen and naproxen;  These medications help both pain and swelling and are very useful in treating back pain.  They should be taken with food, as they can cause stomach upset, and more seriously, stomach bleeding.    Be aware that if you develop new symptoms, such as a fever, leg weakness, difficulty with or loss of control of your urine or bowels, abdominal pain, or more severe pain, you will need to seek medical attention and  / or return to the Emergency department.    Home Care Stay active.  Start with short walks on flat ground if you can.  Try to walk farther  each day. Do not sit, drive or stand in one place for more than 30 minutes.  Do not stay in bed. Do not avoid exercise or work.  Activity can help your back heal faster. Be careful when you bend or lift an object.  Bend at your knees, keep the object close to you, and do not twist. Sleep on a firm mattress.  Lie on your side, and bend your knees.  If you lie on your back, put a pillow under your knees. Only take medicines as told by your doctor. Put ice on the injured area. Put ice in a plastic bag Place a towel between your skin and the bag Leave the ice on for 15-20 minutes, 3-4 times a day for the first 2-3 days. 210 After that, you can switch between ice and heat packs. Ask your doctor about back exercises or massage. Avoid feeling anxious or stressed.  Find good ways to deal with stress, such as exercise.  Get Help Right Way If: Your pain does not go away with rest or medicine. Your pain does not go away in 1 week. You have new problems. You do not feel well. The pain spreads into your legs. You cannot control when you poop (bowel movement) or pee (urinate) You feel sick to your stomach (nauseous) or throw up (vomit) You have belly (abdominal) pain. You feel like you may  pass out (faint). If you develop a fever.  Make Sure you: Understand these instructions. Watch your condition Get help right away if you are not doing well or get worse.  Managing Pain  Pain after surgery or related to activity is often due to strain/injury to muscle, tendon, nerves and/or incisions.  This pain is usually short-term and will improve in a few months.   Many people find it helpful to do the following things TOGETHER to help speed the process of healing and to get back to regular activity more quickly:  Avoid heavy physical activity  no lifting greater than 20 pounds Do not "push through" the pain.  Listen to your body and avoid positions and maneuvers than reproduce the pain Walking is okay  as tolerated, but go slowly and stop when getting sore.  Remember: If it hurts to do it, then don't do it! Take Anti-inflammatory medication  Take with food/snack around the clock for 1-2 weeks This helps the muscle and nerve tissues become less irritable and calm down faster Choose ONE of the following over-the-counter medications: Naproxen 220mg  tabs (ex. Aleve) 1-2 pills twice a day  Ibuprofen 200mg  tabs (ex. Advil, Motrin) 3-4 pills with every meal and just before bedtime Acetaminophen 500mg  tabs (Tylenol) 1-2 pills with every meal and just before bedtime Use a Heating pad or Ice/Cold Pack 4-6 times a day May use warm bath/hottub  or showers Try Gentle Massage and/or Stretching  at the area of pain many times a day stop if you feel pain - do not overdo it  Try these steps together to help you body heal faster and avoid making things get worse.  Doing just one of these things may not be enough.    If you are not getting better after two weeks or are noticing you are getting worse, contact our office for further advice; we may need to re-evaluate you & see what other things we can do to help.

## 2022-05-01 ENCOUNTER — Emergency Department (HOSPITAL_COMMUNITY)
Admission: EM | Admit: 2022-05-01 | Discharge: 2022-05-02 | Disposition: A | Discharge status: Home / Self Care | Payer: 59 | Attending: Emergency Medicine | Admitting: Emergency Medicine

## 2022-05-01 ENCOUNTER — Other Ambulatory Visit: Payer: Self-pay

## 2022-05-01 ENCOUNTER — Encounter (HOSPITAL_COMMUNITY): Payer: Self-pay

## 2022-05-01 DIAGNOSIS — Z7251 High risk heterosexual behavior: Secondary | ICD-10-CM

## 2022-05-01 DIAGNOSIS — T7421XA Adult sexual abuse, confirmed, initial encounter: Secondary | ICD-10-CM | POA: Insufficient documentation

## 2022-05-01 DIAGNOSIS — X58XXXA Exposure to other specified factors, initial encounter: Secondary | ICD-10-CM | POA: Diagnosis not present
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
## 2022-05-02 LAB — COMPREHENSIVE METABOLIC PANEL
ALT: 20 U/L (ref 0–44)
AST: 25 U/L (ref 15–41)
Albumin: 3.9 g/dL (ref 3.5–5.0)
Alkaline Phosphatase: 45 U/L (ref 38–126)
Anion gap: 6 (ref 5–15)
BUN: 9 mg/dL (ref 6–20)
CO2: 26 mmol/L (ref 22–32)
Calcium: 8.6 mg/dL — ABNORMAL LOW (ref 8.9–10.3)
Chloride: 107 mmol/L (ref 98–111)
Creatinine, Ser: 0.84 mg/dL (ref 0.44–1.00)
GFR, Estimated: >60 mL/min (ref >60)
Glucose, Bld: 127 mg/dL — ABNORMAL HIGH (ref 70–99)
Potassium: 4.4 mmol/L (ref 3.5–5.1)
Sodium: 139 mmol/L (ref 135–145)
Total Bilirubin: 0.5 mg/dL (ref 0.3–1.2)
Total Protein: 8.2 g/dL — ABNORMAL HIGH (ref 6.5–8.1)

## 2022-05-02 LAB — RPR: RPR Ser Ql: NONREACTIVE

## 2022-05-02 LAB — HEPATITIS B SURFACE ANTIGEN: Hepatitis B Surface Ag: NONREACTIVE

## 2022-05-02 LAB — RAPID HIV SCREEN (HIV 1/2 AB+AG)
HIV 1/2 Antibodies: NONREACTIVE
HIV-1 P24 Antigen - HIV24: NONREACTIVE

## 2022-05-02 LAB — POC URINE PREG, ED: Preg Test, Ur: NEGATIVE

## 2022-05-02 LAB — HEPATITIS C ANTIBODY: HCV Ab: NONREACTIVE

## 2022-05-02 MED ORDER — AZITHROMYCIN 250 MG PO TABS
1000.0000 mg | ORAL_TABLET | Freq: Once | ORAL | Status: AC
  Administered 2022-05-02: 1000 mg via ORAL
  Filled 2022-05-02: qty 4

## 2022-05-02 MED ORDER — ULIPRISTAL ACETATE 30 MG PO TABS
30.0000 mg | ORAL_TABLET | Freq: Once | ORAL | Status: AC
  Administered 2022-05-02: 30 mg via ORAL
  Filled 2022-05-02: qty 1

## 2022-05-02 MED ORDER — CEFTRIAXONE SODIUM 1 G IJ SOLR
500.0000 mg | Freq: Once | INTRAMUSCULAR | Status: AC
  Administered 2022-05-02: 500 mg via INTRAMUSCULAR
  Filled 2022-05-02: qty 10

## 2022-05-02 MED ORDER — METRONIDAZOLE 500 MG PO TABS
2000.0000 mg | ORAL_TABLET | Freq: Once | ORAL | Status: AC
  Administered 2022-05-02: 2000 mg via ORAL
  Filled 2022-05-02: qty 4

## 2022-05-02 MED ORDER — LIDOCAINE HCL (PF) 1 % IJ SOLN
1.0000 mL | Freq: Once | INTRAMUSCULAR | Status: AC
  Administered 2022-05-02: 1 mL
  Filled 2022-05-02: qty 30

## 2022-05-02 NOTE — Discharge Instructions (Signed)
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 group of medications called cephalosporin antibiotics. It will not treat colds, the flu, or infections caused by viruses. This medicine may be used for other purposes; ask your health care provider or pharmacist if you have questions. COMMON BRAND NAME(S): Ceftrisol Plus, Rocephin What should I tell my care team before I take this medication? They need to know if you have any of these conditions: Bleeding disorder High bilirubin level in newborn patients Kidney disease Liver disease Poor nutrition An unusual or allergic reaction to ceftriaxone, other penicillin or cephalosporin antibiotics, other medicines, foods, dyes, or preservatives Pregnant or trying to get pregnant Breast-feeding How should I use this medication?  This medication is injected into a vein or into a muscle. It is usually given by a health care provider in a hospital or clinic setting. It may also be given at home. If  you get this medication at home, you will be taught how to prepare and give it. Use exactly as directed. Take it as directed on the prescription label at the same time every day. Take all of this medication unless your care team tells you to stop it early. Keep taking it even if you think you are better. It is important that you put your used needles and syringes in a special sharps container. Do not put them in a trash can. If you do not have a sharps container, call your care team to get one. Talk to your care team about the use of this medication in children. While it may be prescribed for children as young as newborns for selected conditions, precautions do apply. Overdosage: If you think you have taken too much of this medicine contact a poison control center or emergency room at once. NOTE: This medicine is only for you. Do not share this medicine with others. What if I miss a dose? If you get this medication at the hospital or clinic: It is important not to miss your dose. Call your care team if you are unable to keep an appointment. If you give yourself this medication at home: If you miss a dose, take it as soon as you can. Then continue your normal schedule. If it is almost time for your next dose, take only that dose. Do not take double or extra doses. Call your care team with questions. What may interact with this medication? Birth control pills Intravenous calcium This list may not describe all possible interactions. Give your health care provider a list of all the medicines, herbs, non-prescription drugs, or dietary supplements you use. Also tell them if you smoke, drink alcohol, or use illegal drugs. Some items may interact with your medicine. What should I watch for while using this medication? Tell your care team if your symptoms do not start to get better or if they get worse. Do not treat diarrhea with over the counter products. Contact your care team if you have diarrhea that lasts  more than 2 days or if it is severe and watery. If you have diabetes, you may get a false-positive result for sugar in your urine. Check with your care team. If you are being treated for a sexually transmitted disease (STD), avoid sexual contact until you have finished your treatment. Your sexual partner may also need treatment. What side effects may I notice from receiving this medication? Side effects that you should report to your care team as soon as possible: Allergic reactions-skin rash, itching, hives, swelling of the face, lips, tongue, or throat Confusion Drowsiness Gallbladder problems-severe stomach pain, nausea, vomiting, fever Kidney injury-decrease in the amount of urine, swelling of the ankles, hands, or feet Kidney stones-blood in the urine, pain or trouble passing urine, pain in the lower back or sides Low red blood cell count-unusual weakness or fatigue, dizziness, headache, trouble breathing Pancreatitis-severe stomach pain that spreads to your back or gets worse after eating or when touched, fever, nausea, vomiting Seizures Severe diarrhea, fever Unusual weakness or fatigue Side effects that usually do not require medical attention (report to your care team if they continue or are bothersome): Diarrhea This list may not describe all possible side effects. Call your doctor for medical advice  about side effects. You may report side effects to FDA at 1-800-FDA-1088. Where should I keep my medication? Keep out of the reach of children and pets. You will be instructed on how to store this medication. Get rid of any unused medication after the expiration date. To get rid of medications that are no longer needed or have expired: Take the medication to a medication take-back program. Check with your pharmacy or law enforcement to find a location. If you cannot return the medication, ask your care team how to get rid of this medication safely. NOTE: This sheet is a summary. It may  not cover all possible information. If you have questions about this medicine, talk to your doctor, pharmacist, or health care provider.  2022 Elsevier/Gold Standard (2020-05-30 09:56:16)

## 2022-05-02 NOTE — SANE Note (Signed)
Discharge vital signs 156/108 100% RA 62 97.9 18
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
## 2023-09-21 ENCOUNTER — Encounter (HOSPITAL_COMMUNITY): Payer: Self-pay | Admitting: Emergency Medicine

## 2023-09-21 ENCOUNTER — Emergency Department (HOSPITAL_COMMUNITY)
Admission: EM | Admit: 2023-09-21 | Discharge: 2023-09-21 | Disposition: A | Discharge status: Home / Self Care | Attending: Emergency Medicine | Admitting: Emergency Medicine

## 2023-09-21 DIAGNOSIS — T5991XA Toxic effect of unspecified gases, fumes and vapors, accidental (unintentional), initial encounter: Secondary | ICD-10-CM | POA: Diagnosis present

## 2023-09-21 NOTE — ED Triage Notes (Signed)
 Pt was refilling lighter with butane and began to feel dizzy when she stood up. Felt like breathing was becoming issue so called nurse line and was told to come. She is alert and oriented x 4; no respiratory distress; sats 100%.
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
